# Patient Record
Sex: Female | Born: 1965 | Race: White | Hispanic: No | Smoking: Former smoker
Health system: Southern US, Community
[De-identification: ages and names within clinical notes are randomized; demographics above are authoritative.]

## PROBLEM LIST (undated history)

## (undated) DIAGNOSIS — R7303 Prediabetes: Secondary | ICD-10-CM

## (undated) DIAGNOSIS — K5790 Diverticulosis of intestine, part unspecified, without perforation or abscess without bleeding: Secondary | ICD-10-CM

## (undated) DIAGNOSIS — E063 Autoimmune thyroiditis: Secondary | ICD-10-CM

## (undated) DIAGNOSIS — I82C11 Acute embolism and thrombosis of right internal jugular vein: Secondary | ICD-10-CM

## (undated) DIAGNOSIS — E039 Hypothyroidism, unspecified: Secondary | ICD-10-CM

## (undated) DIAGNOSIS — E785 Hyperlipidemia, unspecified: Secondary | ICD-10-CM

## (undated) DIAGNOSIS — I341 Nonrheumatic mitral (valve) prolapse: Secondary | ICD-10-CM

## (undated) HISTORY — DX: Prediabetes: R73.03

## (undated) HISTORY — DX: Diverticulosis of intestine, part unspecified, without perforation or abscess without bleeding: K57.90

## (undated) HISTORY — DX: Acute embolism and thrombosis of right internal jugular vein: I82.C11

## (undated) HISTORY — DX: Nonrheumatic mitral (valve) prolapse: I34.1

## (undated) HISTORY — DX: Autoimmune thyroiditis: E06.3

## (undated) HISTORY — DX: Hypothyroidism, unspecified: E03.9

## (undated) HISTORY — DX: Hyperlipidemia, unspecified: E78.5

---

## 2015-07-19 DIAGNOSIS — D72829 Elevated white blood cell count, unspecified: Secondary | ICD-10-CM | POA: Insufficient documentation

## 2019-05-04 HISTORY — PX: OTHER SURGICAL HISTORY: SHX169

## 2019-06-14 DIAGNOSIS — F17200 Nicotine dependence, unspecified, uncomplicated: Secondary | ICD-10-CM | POA: Insufficient documentation

## 2019-06-19 ENCOUNTER — Other Ambulatory Visit: Payer: Self-pay | Admitting: Surgery

## 2019-06-19 DIAGNOSIS — R599 Enlarged lymph nodes, unspecified: Secondary | ICD-10-CM

## 2019-06-19 DIAGNOSIS — R911 Solitary pulmonary nodule: Secondary | ICD-10-CM

## 2019-06-23 DIAGNOSIS — R590 Localized enlarged lymph nodes: Secondary | ICD-10-CM | POA: Insufficient documentation

## 2019-06-26 ENCOUNTER — Encounter (HOSPITAL_COMMUNITY): Payer: PRIVATE HEALTH INSURANCE

## 2019-07-04 DIAGNOSIS — C3431 Malignant neoplasm of lower lobe, right bronchus or lung: Secondary | ICD-10-CM | POA: Insufficient documentation

## 2019-07-06 DIAGNOSIS — I82C11 Acute embolism and thrombosis of right internal jugular vein: Secondary | ICD-10-CM | POA: Insufficient documentation

## 2019-07-08 ENCOUNTER — Telehealth: Payer: Self-pay | Admitting: Internal Medicine

## 2019-07-08 NOTE — Telephone Encounter (Signed)
Received a new pt referral for Pam Wilson to see Dr. Julien Nordmann for lung cancer on 4/20 at 2:15pm w/labs at 1:45pm. Pt aware to arrive 30 minutes early.

## 2019-07-14 ENCOUNTER — Ambulatory Visit: Payer: PRIVATE HEALTH INSURANCE | Admitting: Internal Medicine

## 2019-07-15 ENCOUNTER — Encounter: Payer: Self-pay | Admitting: Internal Medicine

## 2019-07-15 ENCOUNTER — Other Ambulatory Visit: Payer: Self-pay

## 2019-07-15 ENCOUNTER — Other Ambulatory Visit: Payer: Self-pay | Admitting: Medical Oncology

## 2019-07-15 ENCOUNTER — Inpatient Hospital Stay (HOSPITAL_BASED_OUTPATIENT_CLINIC_OR_DEPARTMENT_OTHER): Payer: 59 | Admitting: Internal Medicine

## 2019-07-15 ENCOUNTER — Inpatient Hospital Stay: Payer: 59

## 2019-07-15 ENCOUNTER — Encounter: Payer: Self-pay | Admitting: *Deleted

## 2019-07-15 DIAGNOSIS — C3411 Malignant neoplasm of upper lobe, right bronchus or lung: Secondary | ICD-10-CM | POA: Diagnosis not present

## 2019-07-15 DIAGNOSIS — C3491 Malignant neoplasm of unspecified part of right bronchus or lung: Secondary | ICD-10-CM | POA: Insufficient documentation

## 2019-07-15 DIAGNOSIS — Z801 Family history of malignant neoplasm of trachea, bronchus and lung: Secondary | ICD-10-CM

## 2019-07-15 DIAGNOSIS — Z87891 Personal history of nicotine dependence: Secondary | ICD-10-CM

## 2019-07-15 DIAGNOSIS — R7303 Prediabetes: Secondary | ICD-10-CM

## 2019-07-15 DIAGNOSIS — C778 Secondary and unspecified malignant neoplasm of lymph nodes of multiple regions: Secondary | ICD-10-CM

## 2019-07-15 DIAGNOSIS — I82C11 Acute embolism and thrombosis of right internal jugular vein: Secondary | ICD-10-CM

## 2019-07-15 DIAGNOSIS — C3490 Malignant neoplasm of unspecified part of unspecified bronchus or lung: Secondary | ICD-10-CM

## 2019-07-15 DIAGNOSIS — Z5111 Encounter for antineoplastic chemotherapy: Secondary | ICD-10-CM | POA: Insufficient documentation

## 2019-07-15 DIAGNOSIS — Z7189 Other specified counseling: Secondary | ICD-10-CM | POA: Insufficient documentation

## 2019-07-15 MED ORDER — LIDOCAINE-PRILOCAINE 2.5-2.5 % EX CREA: TOPICAL_CREAM | CUTANEOUS | 0 refills | Status: DC

## 2019-07-15 MED ORDER — PROCHLORPERAZINE MALEATE 10 MG PO TABS: 10.0000 mg | ORAL_TABLET | Freq: Four times a day (QID) | ORAL | 0 refills | Status: DC | PRN

## 2019-07-15 NOTE — Progress Notes (Signed)
START ON PATHWAY REGIMEN - Non-Small Cell Lung     Administer weekly:     Paclitaxel      Carboplatin   **Always confirm dose/schedule in your pharmacy ordering system**  Patient Characteristics: Preoperative or Nonsurgical Candidate (Clinical Staging), Stage III - Nonsurgical Candidate (Nonsquamous and Squamous), PS = 0, 1 Therapeutic Status: Preoperative or Nonsurgical Candidate (Clinical Staging) AJCC T Category: cT2a AJCC N Category: cN3 AJCC M Category: cM0 AJCC 8 Stage Grouping: IIIB ECOG Performance Status: 1 Intent of Therapy: Curative Intent, Discussed with Patient

## 2019-07-15 NOTE — Progress Notes (Signed)
Oncology Nurse Navigator Documentation  Oncology Nurse Navigator Flowsheets 07/15/2019  Diagnosis Status Pending Molecular Studies  Planned Course of Treatment Chemo/Radiation Concurrent  Phase of Treatment Chemo/Radiation Concurrent  Navigator Follow Up Date: 07/18/2019  Navigator Follow Up Reason: Appointment Review  Navigator Location [facility]  Navigator Encounter Type Clinic/MDC;Initial MedOnc  Patient Visit Type MedOnc/spoke with patient and daughter today at clinic.  Pam Wilson has been dx with stage III lung cancer and her treatment plan is concurrent chemo rad.  I help to explain next steps.  I will reach out to auth coordinator to get MRI brain auth.  I will update Rad Onc on referral.  I will follow up on pathology and where next gen sequencing was completed.  It was a pleasure meeting them today, very nice family.  Treatment Phase Pre-Tx/Tx Discussion  Barriers/Navigation Needs Coordination of Care;Education  Education Other  Interventions Coordination of Care;Education;Psycho-Social Support  Acuity Level 2-Minimal Needs (1-2 Barriers Identified)  Coordination of Care Other  Education Method Verbal  Time Spent with Patient 30

## 2019-07-15 NOTE — Progress Notes (Signed)
[place] Telephone:([phone]) [phone]   [phone]  CONSULT NOTE  REFERRING PHYSICIAN: Dr. Wilmon Arms  REASON FOR CONSULTATION:  54 years old white female recently diagnosed with lung cancer.  HPI Pam Wilson is a 54 y.o. female with past medical history significant for mitral valve prolapse, Hashimoto's thyroiditis, hypothyroidism, dyslipidemia, diverticulosis as well as prediabetes, right internal jugular vein thrombosis as well as long history of smoking.  The patient mentions that a month ago she was seen by her primary care physician complaining of swelling and pain in the right neck area.  She was sent to the emergency department for evaluation.  CT of the neck was performed on June 14, 2019 and it showed thrombosis of a portion of the right internal jugular vein in the mid to lower neck extending down to the subclavian confluence.  There was also appear to be multiple enlarged lymph nodes within the upper mediastinum as well as the right supraclavicular region the largest lymph node seen in the upper mediastinum appeared approximately 1.8 cm in short axis in the right paratracheal region.  The largest lymph node in the supraclavicular region on the right measured approximately 0.9 cm concerning for possible lymphoproliferative disorder versus metastatic adenopathy.  The patient had CT of the chest performed on June 16, 2019 and it showed spiculated mass in the right upper lobe anteriorly measuring 2.4 x 1.5 cm consistent with bronchogenic carcinoma.  There was smooth pulmonary nodule along the minor fissure between the right middle lobe and right upper lobe measuring 0.8 x 0.5 cm indeterminate for noncalcified granuloma versus hematogenous metastasis.  There was severe mediastinal lymphadenopathy and bulky right hilar adenopathy extending to involve the right lower lobe bronchovascular bundle.  There was no evidence for pulmonary embolus.  CT of the abdomen and  pelvis on the same day showed no acute abnormalities and no evidence of metastatic disease in the abdomen or pelvis.  She underwent CT-guided core biopsy of the right upper lobe lung mass by interventional radiology but this was not conclusive for malignancy and complicated with pneumothorax requiring chest tube placement.  The patient also had a PET scan performed on June 22, 2019 and it showed hypermetabolic right upper lobe neoplasm in addition to pathologic right hilar and mediastinal lymphadenopathy there was no evidence of contralateral left hilar adenopathy or distant metastatic disease.  The patient underwent mediastinoscopy on June 25, 2019 under the care of Dr. Eulas Post.  The final pathology of the cervical lymph node excisional biopsy as well as right level 3 excisional biopsy showed metastatic poorly differentiated non-small cell carcinoma. The carcinoma is strongly diffusely positive for TTF-1, favoring metastatic adenocarcinoma of the lung. 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 The pathology biopsy was sent for molecular studies but the results are not available to me at this point. The patient moved to [facility] to be close to her daughter during the treatment. She was referred to me today for evaluation and recommendation regarding treatment of her condition. When seen today she is feeling tired and she has a lot of stress after the moving process.  She denied having any current chest pain, shortness of breath, cough or hemoptysis.  She has pain on the right shoulder area.  She denied having any nausea, vomiting, diarrhea or constipation.  She denied having any headache or visual changes. Family history significant for mother with lung cancer at age 47, brother had lung  cancer at age 54 and father had diabetes mellitus. The patient is single and has 3 children.  She was accompanied today by her daughter Pam Wilson.  She used  to work for [employer].  She has a history of smoking 1 pack/day for around 38 years and quit June 14, 2019.  The patient has no history of alcohol or drug abuse.  HPI  Past Medical History:  Diagnosis Date   Diverticulosis    Dyslipidemia    Hashimoto's thyroiditis    Hypothyroidism    Internal jugular vein thrombosis, right (HCC)    Mitral valve prolapse    Prediabetes     History reviewed. No pertinent surgical history.  Family History  Problem Relation Age of Onset   Lung cancer Mother    Diabetes Mellitus II Father    Lung cancer Brother     Social History Social History   Tobacco Use   Smoking status: Former Smoker    Packs/day: 1.00    Years: 38.00    Pack years: 38.00    Types: Cigarettes   Smokeless tobacco: Never Used  Substance Use Topics   Alcohol use: Not Currently   Drug use: Never    Allergies  Allergen Reactions   Sulfamethoxazole-Trimethoprim Hives   Penicillins Rash    Current Outpatient Medications  Medication Sig Dispense Refill   apixaban (ELIQUIS) 5 MG TABS tablet Take by mouth.     bacitracin ointment Apply topically.     colesevelam (WELCHOL) 625 MG tablet Take by mouth.     colesevelam (WELCHOL) 625 MG tablet Take 1,250 mg by mouth daily.     DENTA 5000 PLUS 1.1 % CREA dental cream BRUSH THOUROGHLY TWICE DAILY DO NOT RINSE EAT OR DRINK AFTERWARD     EUTHYROX 112 MCG tablet Take 112 mcg by mouth daily.     levothyroxine (SYNTHROID) 112 MCG tablet Take by mouth.     liothyronine (CYTOMEL) 5 MCG tablet Take by mouth.     metFORMIN (GLUCOPHAGE-XR) 750 MG 24 hr tablet Take by mouth.     Multiple Vitamin (MULTI-VITAMIN) tablet Take by mouth.     rosuvastatin (CRESTOR) 5 MG tablet Take 5 mg by mouth daily.     No current facility-administered medications for this visit.    Review of Systems  Constitutional: positive for fatigue Eyes: negative Ears, nose, mouth, throat, and face:  negative Respiratory: negative Cardiovascular: negative Gastrointestinal: negative Genitourinary:negative Integument/breast: negative Hematologic/lymphatic: negative Musculoskeletal:negative Neurological: negative Behavioral/Psych: negative Endocrine: negative Allergic/Immunologic: negative  Physical Exam  OEH:OZYYQ, healthy, no distress, well nourished, well developed and anxious SKIN: skin color, texture, turgor are normal, no rashes or significant lesions HEAD: Normocephalic, No masses, lesions, tenderness or abnormalities EYES: normal, PERRLA, Conjunctiva are pink and non-injected EARS: External ears normal, Canals clear OROPHARYNX:no exudate, no erythema and lips, buccal mucosa, and tongue normal  NECK: supple, no adenopathy, no JVD LYMPH:  no palpable lymphadenopathy, no hepatosplenomegaly BREAST:not examined LUNGS: clear to auscultation , and palpation HEART: regular rate & rhythm, no murmurs and no gallops ABDOMEN:abdomen soft, non-tender, normal bowel sounds and no masses or organomegaly BACK: No CVA tenderness, Range of motion is normal EXTREMITIES:no joint deformities, effusion, or inflammation, no edema  NEURO: alert & oriented x 3 with fluent speech, no focal motor/sensory deficits  PERFORMANCE STATUS: ECOG 1  LABORATORY DATA: No results found for: WBC, HGB, HCT, MCV, PLT    Chemistry   No results found for: NA, K, CL, CO2, BUN, CREATININE, GLU No  results found for: CALCIUM, ALKPHOS, AST, ALT, BILITOT     RADIOGRAPHIC STUDIES: No results found.  ASSESSMENT: This is a very pleasant 54 years old white female recently diagnosed with a stage IIIb (T1c, N3, M0) non-small cell lung cancer, adenosquamous carcinoma presented with right upper lobe lung nodule in addition to mediastinal and supraclavicular lymphadenopathy diagnosed in March 2021.  Molecular studies are still pending.   PLAN: I had a lengthy discussion with the patient and her daughter today about her  current disease stage, prognosis and treatment options. I recommended for the patient to complete the staging work-up by ordering MRI of the brain to rule out brain metastasis. I will also request the molecular study report from [facility] once it becomes available. I discussed with the patient her treatment options and I recommended for her a course of concurrent chemoradiation with weekly carboplatin for AUC of 2 and paclitaxel 45 NG/M2 for 6-7 weeks followed by consolidation immunotherapy if the patient has no evidence for disease progression after the induction phase. I discussed with the patient the adverse effect of the chemotherapy including possibility for mild alopecia, myelosuppression, nausea and vomiting, peripheral neuropathy, liver or renal dysfunction. She is interested in proceeding with the treatment. She is expected to start the first dose of this treatment on Jul 28, 2019. I will refer the patient to radiation oncology for evaluation and discussion of the radiotherapy portion of this treatment. I will arrange for the patient to have a chemotherapy education class before the first dose of her treatment. I will call her pharmacy with prescription for Compazine 10 mg p.o. every 6 hours as needed for nausea in addition to EMLA cream to be applied to the Port-A-Cath site before treatment. The patient will come back for follow-up visit in 2 weeks with the first day of her treatment. For smoking cessation, I strongly encouraged the patient to continue with the current plan of quitting smoking. She was advised to call immediately if she has any concerning symptoms in the interval.  The patient voices understanding of current disease status and treatment options and is in agreement with the current care plan.  All questions were answered. The patient knows to call the clinic with any problems, questions or concerns. We can certainly see the patient much sooner if necessary.  Thank you  so much for allowing me to participate in the care of Pam Wilson. I will continue to follow up the patient with you and assist in her care.   Disclaimer: This note was dictated with voice recognition software. Similar sounding words can inadvertently be transcribed and may not be corrected upon review.   Eilleen Kempf July 15, 2019, 2:36 PM

## 2019-07-16 ENCOUNTER — Inpatient Hospital Stay
Admission: RE | Admit: 2019-07-16 | Discharge: 2019-07-16 | Disposition: A | Discharge status: Home / Self Care | Payer: Self-pay | Source: Ambulatory Visit | Attending: Internal Medicine | Admitting: Internal Medicine

## 2019-07-16 ENCOUNTER — Ambulatory Visit
Admission: RE | Admit: 2019-07-16 | Discharge: 2019-07-16 | Disposition: A | Discharge status: Home / Self Care | Payer: Self-pay | Source: Ambulatory Visit | Attending: Internal Medicine | Admitting: Internal Medicine

## 2019-07-16 ENCOUNTER — Other Ambulatory Visit (HOSPITAL_COMMUNITY): Payer: Self-pay | Admitting: Internal Medicine

## 2019-07-16 ENCOUNTER — Telehealth: Payer: Self-pay | Admitting: Medical Oncology

## 2019-07-16 DIAGNOSIS — C801 Malignant (primary) neoplasm, unspecified: Secondary | ICD-10-CM

## 2019-07-16 NOTE — Telephone Encounter (Signed)
I left a  VM to fax molecular studies to  [phone] and to call for any questions.

## 2019-07-16 NOTE — Telephone Encounter (Signed)
Duplicate orders-MRI order brain( from another doctor)  is in process of being authorized . I told pt that she will need to go to that facility to get her brain MRI. I told her Dr Julien Nordmann 's order for MRI brain cannot be processed. I instructed pt to contact the doctor who ordered the brain MRI and let them know she has not heard about an appt . She said she wants it done here. I told her that it may be hard for that first doctor to cancel his mri brain  order since it is already in process to be authorized.  I cancelled Dr Worthy Flank MRI brain order.

## 2019-07-16 NOTE — Progress Notes (Signed)
Thoracic Location of Tumor / Histology: RUL- Non small cell  Patient presented with right sided neck swelling.  MRI Brain unscheduled at this time.  PET 1/60/7371: hypermetabolic right upper lobe neoplasm in addition to pathologic right hilar and mediastinal lymphadenopathy there was no evidence of contralateral left hilar adenopathy or distant metastatic disease.  CT abdomen/pelvis 06/16/2019: no acute abnormalities and no evidence of metastatic disease in the abdomen or pelvis.   CT Chest 06/16/2019: spiculated mass in the right upper lobe anteriorly measuring 2.4 x 1.5 cm consistent with bronchogenic carcinoma.  There was smooth pulmonary nodule along the minor fissure between the right middle lobe and right upper lobe measuring 0.8 x 0.5 cm indeterminate for noncalcified granuloma versus hematogenous metastasis.  There was severe mediastinal lymphadenopathy and bulky right hilar adenopathy extending to involve the right lower lobe bronchovascular bundle.  There was no evidence for pulmonary embolus.  CT Neck 06/14/2019: thrombosis of a portion of the right internal jugular vein in the mid to lower neck extending down to the subclavian confluence.  There was also appear to be multiple enlarged lymph nodes within the upper mediastinum as well as the right supraclavicular region the largest lymph node seen in the upper mediastinum appeared approximately 1.8 cm in short axis in the right paratracheal region.  The largest lymph node in the supraclavicular region on the right measured approximately 0.9 cm concerning for possible lymphoproliferative disorder versus metastatic adenopathy.      Biopsies of Lymph nodes 07/01/2019    Biopsies of RUL Lung 06/24/2019   Tobacco/Marijuana/Snuff/ETOH use: Former Smoker, quit 05/2019.  Past/Anticipated interventions by cardiothoracic surgery, if any:   Past/Anticipated interventions by medical oncology, if any:  Dr. Mckinley Jewel 07/15/2019 -I recommended for the  patient to complete the staging work-up by ordering MRI of the brain to rule out brain metastasis. -I will also request the molecular study report from [facility] once it becomes available. -I discussed with the patient her treatment options and I recommended for her a course of concurrent chemoradiation with weekly carboplatin for AUC of 2 and paclitaxel 45 NG/M2 for 6-7 weeks followed by consolidation immunotherapy if the patient has no evidence for disease progression after the induction phase. -She is interested in proceeding with the treatment. -She is expected to start the first dose of this treatment on Jul 28, 2019. -I will refer the patient to radiation oncology for evaluation and discussion of the radiotherapy portion of this treatment. -I will arrange for the patient to have a chemotherapy education class before the first dose of her treatment.  Signs/Symptoms  Weight changes, if any: No  Respiratory complaints, if any: No issues noted.  Hemoptysis, if any: No cough, no blood.  Pain issues, if any:  No  SAFETY ISSUES:  Prior radiation? No  Pacemaker/ICD? No  Possible current pregnancy? No  Is the patient on methotrexate? No  Current Complaints / other details:

## 2019-07-17 ENCOUNTER — Encounter: Payer: Self-pay | Admitting: Radiation Oncology

## 2019-07-17 ENCOUNTER — Inpatient Hospital Stay: Admission: RE | Admit: 2019-07-17 | Payer: Self-pay | Source: Ambulatory Visit | Admitting: Radiation Oncology

## 2019-07-17 ENCOUNTER — Encounter: Payer: Self-pay | Admitting: General Practice

## 2019-07-17 ENCOUNTER — Encounter: Payer: Self-pay | Admitting: *Deleted

## 2019-07-17 ENCOUNTER — Ambulatory Visit: Payer: PRIVATE HEALTH INSURANCE

## 2019-07-17 ENCOUNTER — Ambulatory Visit: Payer: PRIVATE HEALTH INSURANCE | Admitting: Radiation Oncology

## 2019-07-17 ENCOUNTER — Ambulatory Visit
Admission: RE | Admit: 2019-07-17 | Discharge: 2019-07-17 | Disposition: A | Discharge status: Home / Self Care | Payer: PRIVATE HEALTH INSURANCE | Source: Ambulatory Visit | Attending: Radiation Oncology | Admitting: Radiation Oncology

## 2019-07-17 ENCOUNTER — Inpatient Hospital Stay
Admission: RE | Admit: 2019-07-17 | Payer: PRIVATE HEALTH INSURANCE | Source: Ambulatory Visit | Admitting: Radiation Oncology

## 2019-07-17 ENCOUNTER — Other Ambulatory Visit: Payer: Self-pay

## 2019-07-17 VITALS — Ht 68.0 in | Wt 139.0 lb

## 2019-07-17 DIAGNOSIS — C3491 Malignant neoplasm of unspecified part of right bronchus or lung: Secondary | ICD-10-CM

## 2019-07-17 DIAGNOSIS — C3411 Malignant neoplasm of upper lobe, right bronchus or lung: Secondary | ICD-10-CM

## 2019-07-17 DIAGNOSIS — C3490 Malignant neoplasm of unspecified part of unspecified bronchus or lung: Secondary | ICD-10-CM

## 2019-07-17 NOTE — Progress Notes (Signed)
[employer] Initial Psychosocial Assessment Clinical Social Work  Clinical Social Work contacted by phone to assess psychosocial, emotional, mental health, and spiritual needs of the patient.   Barriers to care/review of distress screen:  - Transportation:  Do you anticipate any problems getting to appointments?  Do you have someone who can help run errands for you if you need it?  She is able to drive, daughter can help if needed.   - Help at home:  What is your living situation (alone, family, other)?  If you are physically unable to care for yourself, who would you call on to help you?  Has relocated to [facility] to live w daughter while in treatment.  Owns her own home in [place].  Housing stress reported in Distress Screen refers to stress of moving to new home and temporarily relocating to [place] to be w daughter during treatment.   - Support system:  What does your support system look like?  Who would you call on if you needed some kind of practical help?  What if you needed someone to talk to for emotional support?  Is single with three adult children - all live in [place]. Has "a couple of friends, moved from [place] two years ago."  Has developed a support system.    - Finances:  Are you concerned about finances.  Considering returning to work?  If not, applying for disability?  Was working, job opened in [employer] [place], bought new home in [place] and was getting ready to start new job in [place].  Does have short term disability w her company, has submitted paperwork for short term disability to oncologist office.  Has been out of work since 06/14/19.  Has used up all her vacation time.  Advised to call her HR department to clarify that she has short and long term disability.    What is your understanding of where you are with your cancer? Its cause?  Your treatment plan and what happens next?  Recently diagnosed w Stage 3 lung cancer while in process of moving from [place] to [place], [place].   Was "coming home from work and I was having a hard time swallowing."  Didn't resolve by next day, went to MD who "literally walked me over to the ED", had a blood clot, then scans revealed cancer in lungs and lymph nodes.  Plan is 6 - 7 weeks of concurrent chemotherapy and radiation.    If Distress Screen is positive for depression, insert PHQ   If Distress Screen is positive for anxiety, insert GAD 7  What are your worries for the future as you begin treatment for cancer?  FMLA paperwork submitted to [employer] on 4/20.  Not sure she can return to work w port in place and undergoing treatment.  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  Wants to get a will completed.    What are your hopes and priorities during your treatment? What is important to you? What are your goals for your care? Wants to remain active, likes to walk.  Wants to return to work after treatment.  Likes to have snacks with her during treatment.   Appreciates having daughter present during visits.    ONCBCN DISTRESS SCREENING 07/17/2019  Screening Type Initial Screening  Distress experienced in past week (1-10) 7  Practical problem type Housing  Other Contact via phone    CSW Summary:  Patient and family psychosocial functioning including strengths, limitations,  and coping skills: 54 yo single female, newly diagnosed with Stage 3 lung cancer.  Will have concurrent chemotherapy and radiation for 6 - 7 weeks.  Temporarily living w daughter in [place], has home in [place] and job in [place]. After treatment, she plans to return to work/home.  Is normally very active - goes to gym, lifts weights, walks.  Has physical job.  Was a bit more tired than usual in past several months, but was also moving from one house to another and has physical stress of packing/moving.  No previous health challenges.  Supportive family, all adult children.  CSW and patient discussed common feeling and emotions when being  diagnosed with cancer, and the importance of support during treatment. CSW informed patient of the support team and support services at [facility]. CSW provided contact information and encouraged patient to call with any questions or concerns.  Identifications of barriers to care:  Wants to be sure FMLA and short term disability paperwork is completed by [facility] team so her job is protected.    Availability of community resources:  Lung Cancer Initiative, Cancer and Careers, Triage Cancer  Clinical Social Worker follow up needed: No.   Edwyna Shell, [place] Social Worker Phone:  [phone] Cell:  [phone]

## 2019-07-17 NOTE — Progress Notes (Signed)
Radiation Oncology         [phone]) [phone] ________________________________  Name: Pam Wilson        MRN: [id number]  Date of Service: 07/17/2019 DOB: 1965-10-05  CH:YIFOYDX, No Pcp Per  Curt Bears, MD     REFERRING PHYSICIAN: Curt Bears, MD   DIAGNOSIS: The primary encounter diagnosis was Non-small cell cancer of right lung ([place]). A diagnosis of Malignant neoplasm of unspecified part of unspecified bronchus or lung ([place]) was also pertinent to this visit.   HISTORY OF PRESENT ILLNESS: Pam Wilson is a 54 y.o. female seen at the request of Dr. Julien Nordmann for a newly diagnosed lung cancer. The patient started noting swelling in her neck a little over a month ago and she was sent for CT of the neck which was performed on 06/14/2019 revealing a thrombus in the right internal jugular vein extending down to the subclavian confluence, there were also multiple enlarged lymph nodes in the right supraclavicular region and in the mediastinum as an example and an 11 mm and 12 mm lymph node were seen in the right paratracheal region, another measuring 18 mm, the largest in the supraclavicular region was 9 mm on the right.  She proceeded with CT chest abdomen and pelvis on 06/16/2019 this revealed moderate emphysematous and fibrotic changes of the lungs, a 24 x 15 L spiculated right upper lobe mass, and smooth pulmonary nodule along the minor fissure of the right middle lobe and right upper lobe measuring 8 x 5 mm.  Significant mediastinal adenopathy including bulky right hilar adenopathy was identified.  She subsequently underwent a biopsy on 06/19/2019 to sample the right upper lobe nodule.  Final pathology revealed benign pleura with fibrosis negative for malignancy.  Unfortunately she developed a pneumothorax, and had a chest tube placed on 06/19/2019 as well.  She was able to undergo pet imaging on 06/22/2019 which revealed hypermetabolism in the right upper lobe, right hilum and mediastinal  adenopathy.  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  Her cervical lymph node was also consistent with metastatic poorly differentiated non-small cell lung cancer also consistent with adenosquamous features.  She was evaluated by Dr. Julien Nordmann and she is planning to undergo an MRI of the brain to complete her staging work-up, it appears that she has stage III disease, and he has discussed that provided her MRI is clear of disease, she would be a candidate for chemoradiation.  She is seen today to discuss treatment recommendations.     PREVIOUS RADIATION THERAPY: No   PAST MEDICAL HISTORY:  Past Medical History:  Diagnosis Date  . Diverticulosis   . Dyslipidemia   . Hashimoto's thyroiditis   . Hypothyroidism   . Internal jugular vein thrombosis, right ([place])   . Mitral valve prolapse   . Prediabetes        PAST SURGICAL HISTORY:No past surgical history on file.   FAMILY HISTORY:  Family History  Problem Relation Age of Onset  . Lung cancer Mother   . Diabetes Mellitus II Father   . Lung cancer Brother      SOCIAL HISTORY:  reports that she has quit smoking. Her smoking use included cigarettes. She has a 38.00 pack-year smoking history. She has never used smokeless tobacco. She reports previous alcohol use. She reports that she does not use drugs.The  patient is divorced and lives in [place], and [place] given her diagnosis.   ALLERGIES: Sulfamethoxazole-trimethoprim and Penicillins   MEDICATIONS:  Current Outpatient Medications  Medication Sig Dispense Refill  . apixaban (ELIQUIS) 5 MG TABS tablet Take by mouth.    . bacitracin ointment Apply topically.    . colesevelam (WELCHOL) 625 MG tablet Take by mouth.     . colesevelam (WELCHOL) 625 MG tablet Take 1,250 mg by mouth daily.    . DENTA 5000 PLUS 1.1 % CREA dental cream BRUSH THOUROGHLY TWICE DAILY DO NOT RINSE EAT OR DRINK AFTERWARD    . EUTHYROX 112 MCG tablet Take 112 mcg by mouth daily.    Marland Kitchen levothyroxine (SYNTHROID) 112 MCG tablet Take by mouth.    . lidocaine-prilocaine (EMLA) cream Apply to Port-A-Cath site 30-60 minutes before treatment. 30 g 0  . liothyronine (CYTOMEL) 5 MCG tablet Take by mouth.    . metFORMIN (GLUCOPHAGE-XR) 750 MG 24 hr tablet Take by mouth.    . Multiple Vitamin (MULTI-VITAMIN) tablet Take by mouth.    . prochlorperazine (COMPAZINE) 10 MG tablet Take 1 tablet (10 mg total) by mouth every 6 (six) hours as needed for nausea or vomiting. 30 tablet 0  . rosuvastatin (CRESTOR) 5 MG tablet Take 5 mg by mouth daily.     No current facility-administered medications for this encounter.     REVIEW OF SYSTEMS: On review of systems, the patient reports that she is doing well overall. She has a sore area on her neck from her biopsy that is a hematoma. She denies any chest pain, shortness of breath, cough, fevers, chills, night sweats, unintended weight loss but she's gained about 5 pounds due to recently quitting smoking. She denies any bowel or bladder disturbances, and denies abdominal pain, nausea or vomiting. She denies any new musculoskeletal or joint aches or pains. A complete review of systems is obtained and is otherwise negative.     PHYSICAL EXAM:  Wt Readings from Last 3 Encounters:  07/15/19 139 lb 12.8 oz (63.4 kg)    In general this is a well appearing caucasian female in no acute distress. She's alert and oriented x4 and appropriate throughout the examination. Cardiopulmonary assessment is negative for acute distress and she exhibits normal effort.   ECOG = 1  0 - Asymptomatic (Fully active, able to carry on all predisease activities without restriction)  1 - Symptomatic but completely ambulatory (Restricted  in physically strenuous activity but ambulatory and able to carry out work of a light or sedentary nature. For example, light housework, office work)  2 - Symptomatic, <50% in bed during the day (Ambulatory and capable of all self care but unable to carry out any work activities. Up and about more than 50% of waking hours)  3 - Symptomatic, >50% in bed, but not bedbound (Capable of only limited self-care, confined to bed or chair 50% or more of waking hours)  4 - Bedbound (Completely disabled. Cannot carry on any self-care. Totally confined to bed or chair)  5 - Death   Eustace Pen MM, Creech RH, Tormey DC, et al. [phone]). "Toxicity and response criteria of the [facility]". [place] Oncol. 5 (6): 649-55    LABORATORY DATA:  No results found for: WBC, HGB, HCT, MCV, PLT No results found for: NA, K, CL, CO2 No results found for: ALT, AST, GGT, ALKPHOS, BILITOT    RADIOGRAPHY: DG Outside  Films Chest  Result Date: 07/16/2019 This examination belongs to an outside facility and is stored here for comparison purposes only.  Contact the originating outside institution for any associated report or interpretation.  DG Outside Films Chest  Result Date: 07/16/2019 This examination belongs to an outside facility and is stored here for comparison purposes only.  Contact the originating outside institution for any associated report or interpretation.  CT OUTSIDE FILMS HEAD/FACE  Result Date: 07/16/2019 This examination belongs to an outside facility and is stored here for comparison purposes only.  Contact the originating outside institution for any associated report or interpretation.      IMPRESSION/PLAN: 1. Stage IIIB, cT2aN3M0, NSCLC, adenosquamous carcinoma of the RUL. Dr. Lisbeth Renshaw discusses the pathology findings and reviews the nature of locally advanced lung cancer. He reviews the rationale to proceed with brain MRI which was reordered given her other pending orders from  [facility] that have since been cancelled. She would benefit from chemoradiation and we discussed the rationale for this regimen. We discussed the risks, benefits, short, and long term effects of radiotherapy, and the patient is interested in proceeding. Dr. Lisbeth Renshaw discusses the delivery and logistics of radiotherapy and anticipates a course of 6 1/2 weeks of radiotherapy.  She was given an appointment for Tuesday of next week for simulation at which time she will signed written consent to proceed.  We would plan to begin chemoradiation on 07/28/2019.  This encounter was provided by telemedicine platform MyChart.  The patient has provided two factor identification and has given verbal consent for this type of encounter and has been advised to only accept a meeting of this type in a secure network environment. The time spent during this encounter was 45 minutes including preparation, discussion, and coordination of the patient's care. The attendants for this meeting include Blenda Nicely, RN, Dr. Lisbeth Renshaw, Hayden Pedro  and Nena Alexander and her daughter Rush Barer.  During the encounter,  Blenda Nicely, RN, Dr. Lisbeth Renshaw, and Hayden Pedro were located at [facility] Radiation Oncology Department.  Keylee Shrestha was located at home with her daughter Rush Barer.    The above documentation reflects my direct findings during this shared patient visit. Please see the separate note by Dr. Lisbeth Renshaw on this date for the remainder of the patient's plan of care.    Carola Rhine, PAC

## 2019-07-17 NOTE — Progress Notes (Signed)
Oncology Nurse Navigator Documentation  Oncology Nurse Navigator Flowsheets 07/17/2019  Diagnosis Status -  Planned Course of Treatment -  Phase of Treatment -  Navigator Follow Up Date: -  Navigator Follow Up Reason: -  Navigator Location [facility]  Navigator Encounter Type Other/I called thoracic surgery Dr. [place] office to enquire about molecular testing.  I was given pathology number to call. I called pathology.  Second gen sequencing was not completed.  They will send but need an order.  Dr. Julien Nordmann completed order and I faxed to path dept at [facility].  I also spoke with Les at [facility] with an update.  She gave me the fax number to fax.  Fax completed and verification received.   Patient Visit Type -  Treatment Phase Pre-Tx/Tx Discussion  Barriers/Navigation Needs Coordination of Care  Education -  Interventions Coordination of Care  Acuity Level 3-Moderate Needs (3-4 Barriers Identified)  Coordination of Care Other  Education Method -  Time Spent with Patient 45

## 2019-07-18 ENCOUNTER — Telehealth: Payer: Self-pay | Admitting: *Deleted

## 2019-07-18 NOTE — Telephone Encounter (Signed)
Received call from pt stating that she has an appt on 4/28 for education & she doesn't know what that it.  Returned call & informed pt that this is education for her treatment drugs.  She would like to move this appt to tues 4/27 @ 10 am.  Message to schedulers to take care of this.

## 2019-07-21 ENCOUNTER — Other Ambulatory Visit: Payer: Self-pay | Admitting: Medical Oncology

## 2019-07-21 ENCOUNTER — Telehealth: Payer: Self-pay | Admitting: *Deleted

## 2019-07-21 ENCOUNTER — Telehealth: Payer: Self-pay | Admitting: Internal Medicine

## 2019-07-21 DIAGNOSIS — I878 Other specified disorders of veins: Secondary | ICD-10-CM

## 2019-07-21 NOTE — Telephone Encounter (Signed)
Scheduled per los. Called and spoke with patient. Confirmed appts  

## 2019-07-21 NOTE — Telephone Encounter (Signed)
Spoke with the patient to ask her to reach out to [facility] to make sure that they have cancelled her upcoming MRI scan so that we can order one here.  She verbalized understanding and will reach out to them today.  Will continue to follow as necessary.  Gloriajean Dell. Leonie Green, BSN

## 2019-07-22 ENCOUNTER — Ambulatory Visit
Admission: RE | Admit: 2019-07-22 | Discharge: 2019-07-22 | Disposition: A | Discharge status: Home / Self Care | Payer: 59 | Source: Ambulatory Visit | Attending: Radiation Oncology | Admitting: Radiation Oncology

## 2019-07-22 ENCOUNTER — Other Ambulatory Visit: Payer: Self-pay

## 2019-07-22 ENCOUNTER — Inpatient Hospital Stay: Payer: 59

## 2019-07-22 ENCOUNTER — Encounter: Payer: Self-pay | Admitting: *Deleted

## 2019-07-22 ENCOUNTER — Other Ambulatory Visit: Payer: Self-pay | Admitting: *Deleted

## 2019-07-22 DIAGNOSIS — C3411 Malignant neoplasm of upper lobe, right bronchus or lung: Secondary | ICD-10-CM | POA: Diagnosis present

## 2019-07-22 DIAGNOSIS — C770 Secondary and unspecified malignant neoplasm of lymph nodes of head, face and neck: Secondary | ICD-10-CM | POA: Insufficient documentation

## 2019-07-22 NOTE — Progress Notes (Signed)
Oncology Nurse Navigator Documentation  Oncology Nurse Navigator Flowsheets 07/22/2019  Diagnosis Status -  Planned Course of Treatment -  Phase of Treatment -  Navigator Follow Up Date: -  Navigator Follow Up Reason: -  Navigator Location [facility]  Navigator Encounter Type Other/Ms. Shuping already had a port so port order was cancelled. I reached out to Dr. Julien Nordmann to see if patient needs CXR before her first infusion for port placement.   Patient Visit Type -  Treatment Phase Pre-Tx/Tx Discussion  Barriers/Navigation Needs Coordination of Care  Education -  Interventions Coordination of Care  Acuity Level 2-Minimal Needs (1-2 Barriers Identified)  Coordination of Care Other  Education Method -  Time Spent with Patient 30

## 2019-07-22 NOTE — Progress Notes (Signed)
Pharmacist Chemotherapy Monitoring - Initial Assessment    Anticipated start date: 07/28/2019  Regimen:  . Are orders appropriate based on the patient's diagnosis, regimen, and cycle? Yes . Does the plan date match the patient's scheduled date? Yes . Is the sequencing of drugs appropriate? Yes . Are the premedications appropriate for the patient's regimen? Yes . Prior Authorization for treatment is: Not Started o If applicable, is the correct biosimilar selected based on the patient's insurance? not applicable  Organ Function and Labs: Marland Kitchen Are dose adjustments needed based on the patient's renal function, hepatic function, or hematologic function? No . Are appropriate labs ordered prior to the start of patient's treatment? Yes . Other organ system assessment, if indicated: N/A . The following baseline labs, if indicated, have been ordered: N/A  Dose Assessment: . Are the drug doses appropriate? Yes . Are the following correct: o Drug concentrations Yes o IV fluid compatible with drug Yes o Administration routes Yes o Timing of therapy Yes . If applicable, does the patient have documented access for treatment and/or plans for port-a-cath placement? yes . If applicable, have lifetime cumulative doses been properly documented and assessed? yes Lifetime Dose Tracking  No doses have been documented on this patient for the following tracked chemicals: Doxorubicin, Epirubicin, Idarubicin, Daunorubicin, Mitoxantrone, Bleomycin, Oxaliplatin, Carboplatin, Liposomal Doxorubicin  o   Toxicity Monitoring/Prevention: . The patient has the following take home antiemetics prescribed: Prochlorperazine . The patient has the following take home medications prescribed: N/A . Medication allergies and previous infusion related reactions, if applicable, have been reviewed and addressed. Yes . The patient's current medication list has been assessed for drug-drug interactions with their chemotherapy regimen.  no significant drug-drug interactions were identified on review.  Order Review: . Are the treatment plan orders signed? No . Is the patient scheduled to see a provider prior to their treatment? Yes  I verify that I have reviewed each item in the above checklist and answered each question accordingly.  Philomena Course 07/22/2019 9:09 AM

## 2019-07-23 ENCOUNTER — Other Ambulatory Visit: Payer: PRIVATE HEALTH INSURANCE

## 2019-07-25 ENCOUNTER — Encounter: Payer: Self-pay | Admitting: Medical Oncology

## 2019-07-25 ENCOUNTER — Other Ambulatory Visit: Payer: Self-pay | Admitting: Medical Oncology

## 2019-07-25 ENCOUNTER — Other Ambulatory Visit: Payer: Self-pay | Admitting: Physician Assistant

## 2019-07-25 ENCOUNTER — Encounter: Payer: Self-pay | Admitting: Internal Medicine

## 2019-07-25 ENCOUNTER — Telehealth: Payer: Self-pay | Admitting: *Deleted

## 2019-07-25 DIAGNOSIS — I82890 Acute embolism and thrombosis of other specified veins: Secondary | ICD-10-CM

## 2019-07-25 DIAGNOSIS — C3411 Malignant neoplasm of upper lobe, right bronchus or lung: Secondary | ICD-10-CM | POA: Diagnosis not present

## 2019-07-25 DIAGNOSIS — C3491 Malignant neoplasm of unspecified part of right bronchus or lung: Secondary | ICD-10-CM

## 2019-07-25 MED ORDER — APIXABAN 5 MG PO TABS: 5.0000 mg | ORAL_TABLET | Freq: Every day | ORAL | 1 refills | Status: DC

## 2019-07-25 NOTE — Telephone Encounter (Signed)
Connected with patient to complete short term disability claim form physician's statement.  Request patient's SS# noted in EMR as [id number].  Noted in Medical records.

## 2019-07-25 NOTE — Telephone Encounter (Signed)
Patient said she will pick up completed forms next week.

## 2019-07-25 NOTE — Telephone Encounter (Signed)
Eliquis refill requested.

## 2019-07-28 ENCOUNTER — Inpatient Hospital Stay: Payer: 59

## 2019-07-28 ENCOUNTER — Other Ambulatory Visit: Payer: Self-pay

## 2019-07-28 ENCOUNTER — Encounter: Payer: Self-pay | Admitting: Internal Medicine

## 2019-07-28 ENCOUNTER — Ambulatory Visit
Admission: RE | Admit: 2019-07-28 | Discharge: 2019-07-28 | Disposition: A | Discharge status: Home / Self Care | Payer: 59 | Source: Ambulatory Visit | Attending: Radiation Oncology | Admitting: Radiation Oncology

## 2019-07-28 ENCOUNTER — Inpatient Hospital Stay (HOSPITAL_BASED_OUTPATIENT_CLINIC_OR_DEPARTMENT_OTHER): Payer: 59 | Admitting: Internal Medicine

## 2019-07-28 ENCOUNTER — Other Ambulatory Visit: Payer: Self-pay | Admitting: Internal Medicine

## 2019-07-28 ENCOUNTER — Other Ambulatory Visit: Payer: Self-pay | Admitting: Medical Oncology

## 2019-07-28 VITALS — BP 122/80 | HR 76 | Temp 98.3°F | Resp 17 | Ht 68.0 in | Wt 143.3 lb

## 2019-07-28 VITALS — BP 121/76 | HR 63 | Temp 98.6°F | Resp 16

## 2019-07-28 DIAGNOSIS — Z7189 Other specified counseling: Secondary | ICD-10-CM | POA: Diagnosis not present

## 2019-07-28 DIAGNOSIS — C770 Secondary and unspecified malignant neoplasm of lymph nodes of head, face and neck: Secondary | ICD-10-CM | POA: Diagnosis present

## 2019-07-28 DIAGNOSIS — C3411 Malignant neoplasm of upper lobe, right bronchus or lung: Secondary | ICD-10-CM | POA: Insufficient documentation

## 2019-07-28 DIAGNOSIS — I82890 Acute embolism and thrombosis of other specified veins: Secondary | ICD-10-CM | POA: Diagnosis not present

## 2019-07-28 DIAGNOSIS — Z5111 Encounter for antineoplastic chemotherapy: Secondary | ICD-10-CM

## 2019-07-28 DIAGNOSIS — C3491 Malignant neoplasm of unspecified part of right bronchus or lung: Secondary | ICD-10-CM

## 2019-07-28 DIAGNOSIS — Z95828 Presence of other vascular implants and grafts: Secondary | ICD-10-CM

## 2019-07-28 LAB — CBC WITH DIFFERENTIAL (CANCER CENTER ONLY)
Abs Immature Granulocytes: 0.03 10*3/uL (ref 0.00–0.07)
Basophils Absolute: 0.1 10*3/uL (ref 0.0–0.1)
Basophils Relative: 1 %
Eosinophils Absolute: 0.3 10*3/uL (ref 0.0–0.5)
Eosinophils Relative: 3 %
HCT: 40.8 % (ref 36.0–46.0)
Hemoglobin: 13 g/dL (ref 12.0–15.0)
Immature Granulocytes: 0 %
Lymphocytes Relative: 15 %
Lymphs Abs: 1.4 10*3/uL (ref 0.7–4.0)
MCH: 26.6 pg (ref 26.0–34.0)
MCHC: 31.9 g/dL (ref 30.0–36.0)
MCV: 83.6 fL (ref 80.0–100.0)
Monocytes Absolute: 0.9 10*3/uL (ref 0.1–1.0)
Monocytes Relative: 10 %
Neutro Abs: 6.2 10*3/uL (ref 1.7–7.7)
Neutrophils Relative %: 71 %
Platelet Count: 196 10*3/uL (ref 150–400)
RBC: 4.88 MIL/uL (ref 3.87–5.11)
RDW: 15.6 % — ABNORMAL HIGH (ref 11.5–15.5)
WBC Count: 8.8 10*3/uL (ref 4.0–10.5)
nRBC: 0 % (ref 0.0–0.2)

## 2019-07-28 LAB — CMP (CANCER CENTER ONLY)
ALT: 20 U/L (ref 0–44)
AST: 20 U/L (ref 15–41)
Albumin: 3.9 g/dL (ref 3.5–5.0)
Alkaline Phosphatase: 49 U/L (ref 38–126)
Anion gap: 12 (ref 5–15)
BUN: 13 mg/dL (ref 6–20)
CO2: 26 mmol/L (ref 22–32)
Calcium: 9.3 mg/dL (ref 8.9–10.3)
Chloride: 108 mmol/L (ref 98–111)
Creatinine: 0.69 mg/dL (ref 0.44–1.00)
GFR, Est AFR Am: >60 mL/min (ref >60)
GFR, Estimated: >60 mL/min (ref >60)
Glucose, Bld: 94 mg/dL (ref 70–99)
Potassium: 4.3 mmol/L (ref 3.5–5.1)
Sodium: 146 mmol/L — ABNORMAL HIGH (ref 135–145)
Total Bilirubin: 0.3 mg/dL (ref 0.3–1.2)
Total Protein: 7 g/dL (ref 6.5–8.1)

## 2019-07-28 MED ORDER — HEPARIN SOD (PORK) LOCK FLUSH 100 UNIT/ML IV SOLN
500.0000 [IU] | Freq: Once | INTRAVENOUS | Status: AC | PRN
  Administered 2019-07-28: 500 [IU]
  Filled 2019-07-28: qty 5

## 2019-07-28 MED ORDER — SODIUM CHLORIDE 0.9 % IV SOLN
Freq: Once | INTRAVENOUS | Status: AC
  Filled 2019-07-28: qty 250

## 2019-07-28 MED ORDER — PALONOSETRON HCL INJECTION 0.25 MG/5ML
0.2500 mg | Freq: Once | INTRAVENOUS | Status: AC
  Administered 2019-07-28: 0.25 mg via INTRAVENOUS

## 2019-07-28 MED ORDER — APIXABAN 5 MG PO TABS: 5.0000 mg | ORAL_TABLET | Freq: Every day | ORAL | 1 refills | Status: DC

## 2019-07-28 MED ORDER — FAMOTIDINE IN NACL 20-0.9 MG/50ML-% IV SOLN
20.0000 mg | Freq: Once | INTRAVENOUS | Status: AC
  Administered 2019-07-28: 20 mg via INTRAVENOUS

## 2019-07-28 MED ORDER — DIPHENHYDRAMINE HCL 50 MG/ML IJ SOLN
INTRAMUSCULAR | Status: AC
  Filled 2019-07-28: qty 1

## 2019-07-28 MED ORDER — SODIUM CHLORIDE 0.9% FLUSH
10.0000 mL | INTRAVENOUS | Status: DC | PRN
  Administered 2019-07-28: 10 mL via INTRAVENOUS
  Filled 2019-07-28: qty 10

## 2019-07-28 MED ORDER — SODIUM CHLORIDE 0.9 % IV SOLN
20.0000 mg | Freq: Once | INTRAVENOUS | Status: AC
  Administered 2019-07-28: 20 mg via INTRAVENOUS
  Filled 2019-07-28: qty 20

## 2019-07-28 MED ORDER — SODIUM CHLORIDE 0.9% FLUSH
10.0000 mL | INTRAVENOUS | Status: DC | PRN
  Administered 2019-07-28: 10 mL
  Filled 2019-07-28: qty 10

## 2019-07-28 MED ORDER — SODIUM CHLORIDE 0.9 % IV SOLN
212.8000 mg | Freq: Once | INTRAVENOUS | Status: AC
  Administered 2019-07-28: 210 mg via INTRAVENOUS
  Filled 2019-07-28: qty 21

## 2019-07-28 MED ORDER — FAMOTIDINE IN NACL 20-0.9 MG/50ML-% IV SOLN
INTRAVENOUS | Status: AC
  Filled 2019-07-28: qty 50

## 2019-07-28 MED ORDER — PALONOSETRON HCL INJECTION 0.25 MG/5ML
INTRAVENOUS | Status: AC
  Filled 2019-07-28: qty 5

## 2019-07-28 MED ORDER — SODIUM CHLORIDE 0.9 % IV SOLN
45.0000 mg/m2 | Freq: Once | INTRAVENOUS | Status: AC
  Administered 2019-07-28: 78 mg via INTRAVENOUS
  Filled 2019-07-28: qty 13

## 2019-07-28 MED ORDER — DIPHENHYDRAMINE HCL 50 MG/ML IJ SOLN
50.0000 mg | Freq: Once | INTRAMUSCULAR | Status: AC
  Administered 2019-07-28: 50 mg via INTRAVENOUS

## 2019-07-28 NOTE — Progress Notes (Signed)
Met w/ pt to introduce myself as her Arboriculturist.  Unfortunately there aren't any foundations offering copay assistance for her Dx.  I offered the [employer], went over what it covers, gave her the income requirement and an expense sheet.  She said she was feeling overwhelmed with everything at the moment and she didn't know if she wanted to apply so I gave her my card to contact me if she would like to apply.

## 2019-07-28 NOTE — Progress Notes (Signed)
[facility] Telephone:([phone]   Fax:([phone]) [phone]  OFFICE PROGRESS NOTE  System, Pcp Not In No address on file  DIAGNOSIS: Stage IIIb (T1c, N3, M0) non-small cell lung cancer, adenosquamous carcinoma presented with right upper lobe lung nodule in addition to mediastinal and supraclavicular lymphadenopathy diagnosed in March 2021.  PRIOR THERAPY: None  CURRENT THERAPY: Concurrent chemoradiation with weekly carboplatin for AUC of 2 and paclitaxel 45 NG/M2.  First dose Jul 28, 2019.  INTERVAL HISTORY: Pam Wilson 54 y.o. female returns to the clinic today for returns to the clinic today for follow-up visit.  The patient is feeling fine today with no concerning complaints except for anxiety about starting her treatment today.  She denied having any current chest pain, shortness of breath, cough or hemoptysis.  She denied having any fever or chills.  She has no nausea, vomiting, diarrhea or constipation.  She denied having any headache or visual changes.  Her MRI of the brain was not authorized in [facility] because she already has an order from [facility].  The patient is here today to start the first treatment with concurrent chemoradiation.  MEDICAL HISTORY: Past Medical History:  Diagnosis Date  . Diverticulosis   . Dyslipidemia   . Hashimoto's thyroiditis   . Hypothyroidism   . Internal jugular vein thrombosis, right ([facility]am Wilson)   . Mitral valve prolapse   . Prediabetes     ALLERGIES:  is allergic to sulfamethoxazole-trimethoprim and penicillins.  MEDICATIONS:  Current Outpatient Medications  Medication Sig Dispense Refill  . apixaban (ELIQUIS) 5 MG TABS tablet Take 1 tablet (5 mg total) by mouth daily. 30 tablet 1  . bacitracin ointment Apply topically.    . colesevelam (WELCHOL) 625 MG tablet Take 1,250 mg by mouth daily.    . DENTA 5000 PLUS 1.1 % CREA dental cream BRUSH THOUROGHLY TWICE DAILY DO NOT RINSE EAT OR DRINK AFTERWARD    . EUTHYROX 112 MCG  tablet Take 112 mcg by mouth daily.    Marland Kitchen levothyroxine (SYNTHROID) 112 MCG tablet Take by mouth.    . lidocaine-prilocaine (EMLA) cream Apply to Port-A-Cath site 30-60 minutes before treatment. 30 g 0  . liothyronine (CYTOMEL) 5 MCG tablet Take by mouth.    . metFORMIN (GLUCOPHAGE-XR) 750 MG 24 hr tablet Take by mouth.    . Multiple Vitamin (MULTI-VITAMIN) tablet Take by mouth.    . prochlorperazine (COMPAZINE) 10 MG tablet Take 1 tablet (10 mg total) by mouth every 6 (six) hours as needed for nausea or vomiting. 30 tablet 0  . rosuvastatin (CRESTOR) 5 MG tablet Take 5 mg by mouth daily.     No current facility-administered medications for this visit.    SURGICAL HISTORY: No past surgical history on file.  REVIEW OF SYSTEMS:  Constitutional: negative Eyes: negative Ears, nose, mouth, throat, and face: negative Respiratory: negative Cardiovascular: negative Gastrointestinal: negative Genitourinary:negative Integument/breast: negative Hematologic/lymphatic: negative Musculoskeletal:negative Neurological: negative Behavioral/Psych: positive for anxiety Endocrine: negative Allergic/Immunologic: negative   PHYSICAL EXAMINATION: General appearance: alert, cooperative and no distress Head: Normocephalic, without obvious abnormality, atraumatic Neck: no adenopathy, no JVD, supple, symmetrical, trachea midline and thyroid not enlarged, symmetric, no tenderness/mass/nodules Lymph nodes: Cervical, supraclavicular, and axillary nodes normal. Resp: clear to auscultation bilaterally Back: symmetric, no curvature. ROM normal. No CVA tenderness. Cardio: regular rate and rhythm, S1, S2 normal, no murmur, click, rub or gallop GI: soft, non-tender; bowel sounds normal; no masses,  no organomegaly Extremities: extremities normal, atraumatic, no cyanosis or  edema Neurologic: Alert and oriented X 3, normal strength and tone. Normal symmetric reflexes. Normal coordination and gait  ECOG PERFORMANCE  STATUS: 1 - Symptomatic but completely ambulatory  Blood pressure 122/80, pulse 76, temperature 98.3 F (36.8 C), temperature source Temporal, resp. rate 17, height 5\' 8"  (1.727 m), weight 143 lb 4.8 oz (65 kg), SpO2 98 %.  LABORATORY DATA: Lab Results  Component Value Date   WBC 8.8 07/28/2019   HGB 13.0 07/28/2019   HCT 40.8 07/28/2019   MCV 83.6 07/28/2019   PLT 196 07/28/2019      Chemistry   No results found for: NA, K, CL, CO2, BUN, CREATININE, GLU No results found for: CALCIUM, ALKPHOS, AST, ALT, BILITOT     RADIOGRAPHIC STUDIES: No results found.  ASSESSMENT AND PLAN: This is a very pleasant 54 years old white female recently diagnosed with a stage IIIb non-small cell lung cancer, adenosquamous carcinoma presented with right upper lobe lung nodule in addition to mediastinal and supraclavicular lymphadenopathy diagnosed in March 2021. The staging work-up is not complete because the patient did not have MRI of the brain which is authorized to be done at [facility].  I recommended for her to have her MRI performed there. She did not also have molecular studies after her diagnosis.  I requested it from the pathology department at [facility]. She will proceed with the first treatment with concurrent chemoradiation with carboplatin for AUC of 2 and paclitaxel 45 NG/M2 today. The patient will come back for follow-up visit in 2 weeks for evaluation and management of any adverse effect of her treatment. For the history of pulmonary embolism I will give the patient refill of Eliquis today until she establish care with her primary care physician and transfer her refill to her primary care provider. The patient was advised to call immediately if she has any other concerning symptoms in the interval. The patient voices understanding of current disease status and treatment options and is in agreement with the current care plan.  All questions were answered. The patient knows to call  the clinic with any problems, questions or concerns. We can certainly see the patient much sooner if necessary.   Disclaimer: This note was dictated with voice recognition software. Similar sounding words can inadvertently be transcribed and may not be corrected upon review.

## 2019-07-28 NOTE — Patient Instructions (Addendum)
[facility] Discharge Instructions for Patients Receiving Chemotherapy  Today you received the following chemotherapy agents: Taxol, Carboplatin  To help prevent nausea and vomiting after your treatment, we encourage you to take your nausea medication as directed.   If you develop nausea and vomiting that is not controlled by your nausea medication, call the clinic.   BELOW ARE SYMPTOMS THAT SHOULD BE REPORTED IMMEDIATELY:  *FEVER GREATER THAN 100.5 F  *CHILLS WITH OR WITHOUT FEVER  NAUSEA AND VOMITING THAT IS NOT CONTROLLED WITH YOUR NAUSEA MEDICATION  *UNUSUAL SHORTNESS OF BREATH  *UNUSUAL BRUISING OR BLEEDING  TENDERNESS IN MOUTH AND THROAT WITH OR WITHOUT PRESENCE OF ULCERS  *URINARY PROBLEMS  *BOWEL PROBLEMS  UNUSUAL RASH Items with * indicate a potential emergency and should be followed up as soon as possible.  Feel free to call the clinic should you have any questions or concerns. The clinic phone number is [phone].  Please show the [facility] at check-in to the Emergency Department and triage nurse.  Paclitaxel injection What is this medicine? PACLITAXEL (PAK li TAX el) is a chemotherapy drug. It targets fast dividing cells, like cancer cells, and causes these cells to die. This medicine is used to treat ovarian cancer, breast cancer, lung cancer, Kaposi's sarcoma, and other cancers. This medicine may be used for other purposes; ask your health care provider or pharmacist if you have questions. COMMON BRAND NAME(S): Onxol, Taxol What should I tell my health care provider before I take this medicine? They need to know if you have any of these conditions:  history of irregular heartbeat  liver disease  low blood counts, like low white cell, platelet, or red cell counts  lung or breathing disease, like asthma  tingling of the fingers or toes, or other nerve disorder  an unusual or allergic reaction to paclitaxel, alcohol,  polyoxyethylated castor oil, other chemotherapy, other medicines, foods, dyes, or preservatives  pregnant or trying to get pregnant  breast-feeding How should I use this medicine? This drug is given as an infusion into a vein. It is administered in a hospital or clinic by a specially trained health care professional. Talk to your pediatrician regarding the use of this medicine in children. Special care may be needed. Overdosage: If you think you have taken too much of this medicine contact a poison control center or emergency room at once. NOTE: This medicine is only for you. Do not share this medicine with others. What if I miss a dose? It is important not to miss your dose. Call your doctor or health care professional if you are unable to keep an appointment. What may interact with this medicine? Do not take this medicine with any of the following medications:  disulfiram  metronidazole This medicine may also interact with the following medications:  antiviral medicines for hepatitis, HIV or AIDS  certain antibiotics like erythromycin and clarithromycin  certain medicines for fungal infections like ketoconazole and itraconazole  certain medicines for seizures like carbamazepine, phenobarbital, phenytoin  gemfibrozil  nefazodone  rifampin  St. John's wort This list may not describe all possible interactions. Give your health care provider a list of all the medicines, herbs, non-prescription drugs, or dietary supplements you use. Also tell them if you smoke, drink alcohol, or use illegal drugs. Some items may interact with your medicine. What should I watch for while using this medicine? Your condition will be monitored carefully while you are receiving this medicine. You will need important blood  work done while you are taking this medicine. This medicine can cause serious allergic reactions. To reduce your risk you will need to take other medicine(s) before treatment with this  medicine. If you experience allergic reactions like skin rash, itching or hives, swelling of the face, lips, or tongue, tell your doctor or health care professional right away. In some cases, you may be given additional medicines to help with side effects. Follow all directions for their use. This drug may make you feel generally unwell. This is not uncommon, as chemotherapy can affect healthy cells as well as cancer cells. Report any side effects. Continue your course of treatment even though you feel ill unless your doctor tells you to stop. Call your doctor or health care professional for advice if you get a fever, chills or sore throat, or other symptoms of a cold or flu. Do not treat yourself. This drug decreases your body's ability to fight infections. Try to avoid being around people who are sick. This medicine may increase your risk to bruise or bleed. Call your doctor or health care professional if you notice any unusual bleeding. Be careful brushing and flossing your teeth or using a toothpick because you may get an infection or bleed more easily. If you have any dental work done, tell your dentist you are receiving this medicine. Avoid taking products that contain aspirin, acetaminophen, ibuprofen, naproxen, or ketoprofen unless instructed by your doctor. These medicines may hide a fever. Do not become pregnant while taking this medicine. Women should inform their doctor if they wish to become pregnant or think they might be pregnant. There is a potential for serious side effects to an unborn child. Talk to your health care professional or pharmacist for more information. Do not breast-feed an infant while taking this medicine. Men are advised not to father a child while receiving this medicine. This product may contain alcohol. Ask your pharmacist or healthcare provider if this medicine contains alcohol. Be sure to tell all healthcare providers you are taking this medicine. Certain medicines,  like metronidazole and disulfiram, can cause an unpleasant reaction when taken with alcohol. The reaction includes flushing, headache, nausea, vomiting, sweating, and increased thirst. The reaction can last from 30 minutes to several hours. What side effects may I notice from receiving this medicine? Side effects that you should report to your doctor or health care professional as soon as possible:  allergic reactions like skin rash, itching or hives, swelling of the face, lips, or tongue  breathing problems  changes in vision  fast, irregular heartbeat  high or low blood pressure  mouth sores  pain, tingling, numbness in the hands or feet  signs of decreased platelets or bleeding - bruising, pinpoint red spots on the skin, black, tarry stools, blood in the urine  signs of decreased red blood cells - unusually weak or tired, feeling faint or lightheaded, falls  signs of infection - fever or chills, cough, sore throat, pain or difficulty passing urine  signs and symptoms of liver injury like dark yellow or brown urine; general ill feeling or flu-like symptoms; light-colored stools; loss of appetite; nausea; right upper belly pain; unusually weak or tired; yellowing of the eyes or skin  swelling of the ankles, feet, hands  unusually slow heartbeat Side effects that usually do not require medical attention (report to your doctor or health care professional if they continue or are bothersome):  diarrhea  hair loss  loss of appetite  muscle or joint pain  nausea, vomiting  pain, redness, or irritation at site where injected  tiredness This list may not describe all possible side effects. Call your doctor for medical advice about side effects. You may report side effects to FDA at 1-800-FDA-1088. Where should I keep my medicine? This drug is given in a hospital or clinic and will not be stored at home. NOTE: This sheet is a summary. It may not cover all possible information.  If you have questions about this medicine, talk to your doctor, pharmacist, or health care provider.  2020 Elsevier/Gold Standard (2016-11-14 13:14:55)  Carboplatin injection What is this medicine? CARBOPLATIN (KAR boe pla tin) is a chemotherapy drug. It targets fast dividing cells, like cancer cells, and causes these cells to die. This medicine is used to treat ovarian cancer and many other cancers. This medicine may be used for other purposes; ask your health care provider or pharmacist if you have questions. COMMON BRAND NAME(S): Paraplatin What should I tell my health care provider before I take this medicine? They need to know if you have any of these conditions:  blood disorders  hearing problems  kidney disease  recent or ongoing radiation therapy  an unusual or allergic reaction to carboplatin, cisplatin, other chemotherapy, other medicines, foods, dyes, or preservatives  pregnant or trying to get pregnant  breast-feeding How should I use this medicine? This drug is usually given as an infusion into a vein. It is administered in a hospital or clinic by a specially trained health care professional. Talk to your pediatrician regarding the use of this medicine in children. Special care may be needed. Overdosage: If you think you have taken too much of this medicine contact a poison control center or emergency room at once. NOTE: This medicine is only for you. Do not share this medicine with others. What if I miss a dose? It is important not to miss a dose. Call your doctor or health care professional if you are unable to keep an appointment. What may interact with this medicine?  medicines for seizures  medicines to increase blood counts like filgrastim, pegfilgrastim, sargramostim  some antibiotics like amikacin, gentamicin, neomycin, streptomycin, tobramycin  vaccines Talk to your doctor or health care professional before taking any of these  medicines:  acetaminophen  aspirin  ibuprofen  ketoprofen  naproxen This list may not describe all possible interactions. Give your health care provider a list of all the medicines, herbs, non-prescription drugs, or dietary supplements you use. Also tell them if you smoke, drink alcohol, or use illegal drugs. Some items may interact with your medicine. What should I watch for while using this medicine? Your condition will be monitored carefully while you are receiving this medicine. You will need important blood work done while you are taking this medicine. This drug may make you feel generally unwell. This is not uncommon, as chemotherapy can affect healthy cells as well as cancer cells. Report any side effects. Continue your course of treatment even though you feel ill unless your doctor tells you to stop. In some cases, you may be given additional medicines to help with side effects. Follow all directions for their use. Call your doctor or health care professional for advice if you get a fever, chills or sore throat, or other symptoms of a cold or flu. Do not treat yourself. This drug decreases your body's ability to fight infections. Try to avoid being around people who are sick. This medicine may increase your risk to bruise or bleed.  Call your doctor or health care professional if you notice any unusual bleeding. Be careful brushing and flossing your teeth or using a toothpick because you may get an infection or bleed more easily. If you have any dental work done, tell your dentist you are receiving this medicine. Avoid taking products that contain aspirin, acetaminophen, ibuprofen, naproxen, or ketoprofen unless instructed by your doctor. These medicines may hide a fever. Do not become pregnant while taking this medicine. Women should inform their doctor if they wish to become pregnant or think they might be pregnant. There is a potential for serious side effects to an unborn child. Talk  to your health care professional or pharmacist for more information. Do not breast-feed an infant while taking this medicine. What side effects may I notice from receiving this medicine? Side effects that you should report to your doctor or health care professional as soon as possible:  allergic reactions like skin rash, itching or hives, swelling of the face, lips, or tongue  signs of infection - fever or chills, cough, sore throat, pain or difficulty passing urine  signs of decreased platelets or bleeding - bruising, pinpoint red spots on the skin, black, tarry stools, nosebleeds  signs of decreased red blood cells - unusually weak or tired, fainting spells, lightheadedness  breathing problems  changes in hearing  changes in vision  chest pain  high blood pressure  low blood counts - This drug may decrease the number of white blood cells, red blood cells and platelets. You may be at increased risk for infections and bleeding.  nausea and vomiting  pain, swelling, redness or irritation at the injection site  pain, tingling, numbness in the hands or feet  problems with balance, talking, walking  trouble passing urine or change in the amount of urine Side effects that usually do not require medical attention (report to your doctor or health care professional if they continue or are bothersome):  hair loss  loss of appetite  metallic taste in the mouth or changes in taste This list may not describe all possible side effects. Call your doctor for medical advice about side effects. You may report side effects to FDA at 1-800-FDA-1088. Where should I keep my medicine? This drug is given in a hospital or clinic and will not be stored at home. NOTE: This sheet is a summary. It may not cover all possible information. If you have questions about this medicine, talk to your doctor, pharmacist, or health care provider.  2020 Elsevier/Gold Standard (2007-06-18 14:38:05)

## 2019-07-28 NOTE — Patient Instructions (Signed)

## 2019-07-29 ENCOUNTER — Ambulatory Visit: Payer: 59 | Admitting: Radiation Oncology

## 2019-07-29 ENCOUNTER — Telehealth: Payer: Self-pay | Admitting: *Deleted

## 2019-07-29 ENCOUNTER — Ambulatory Visit
Admission: RE | Admit: 2019-07-29 | Discharge: 2019-07-29 | Disposition: A | Discharge status: Home / Self Care | Payer: 59 | Source: Ambulatory Visit | Attending: Radiation Oncology | Admitting: Radiation Oncology

## 2019-07-29 ENCOUNTER — Telehealth: Payer: Self-pay | Admitting: Medical Oncology

## 2019-07-29 ENCOUNTER — Other Ambulatory Visit: Payer: Self-pay

## 2019-07-29 DIAGNOSIS — I82890 Acute embolism and thrombosis of other specified veins: Secondary | ICD-10-CM

## 2019-07-29 DIAGNOSIS — C3411 Malignant neoplasm of upper lobe, right bronchus or lung: Secondary | ICD-10-CM | POA: Diagnosis not present

## 2019-07-29 MED ORDER — APIXABAN 5 MG PO TABS: 5.0000 mg | ORAL_TABLET | Freq: Two times a day (BID) | ORAL | 1 refills | Status: DC

## 2019-07-29 NOTE — Telephone Encounter (Signed)
Eliquis changed to 5mg  BID.

## 2019-07-29 NOTE — Progress Notes (Signed)
Pharmacist Chemotherapy Monitoring - Follow Up Assessment    I verify that I have reviewed each item in the below checklist:  . Regimen for the patient is scheduled for the appropriate day and plan matches scheduled date. Marland Kitchen Appropriate non-routine labs are ordered dependent on drug ordered. . If applicable, additional medications reviewed and ordered per protocol based on lifetime cumulative doses and/or treatment regimen.   Plan for follow-up and/or issues identified: No . I-vent associated with next due treatment: No . MD and/or nursing notified: No  Sadira Standard D 07/29/2019 12:36 PM

## 2019-07-29 NOTE — Telephone Encounter (Signed)
Per [facility] pharmacy needs dx code 182.C Pt aware of hold up on eloquis authorization. Pt instructed to call number on discount card to activate discount.

## 2019-07-29 NOTE — Telephone Encounter (Signed)
Cannot afford $500 for eloquis. Drug needs PA .  Pt has one tablet left.  Is she eligible for reduced cost at [place]?

## 2019-07-30 ENCOUNTER — Other Ambulatory Visit: Payer: Self-pay | Admitting: Medical Oncology

## 2019-07-30 ENCOUNTER — Telehealth: Payer: Self-pay | Admitting: Medical Oncology

## 2019-07-30 ENCOUNTER — Ambulatory Visit
Admission: RE | Admit: 2019-07-30 | Discharge: 2019-07-30 | Disposition: A | Discharge status: Home / Self Care | Payer: 59 | Source: Ambulatory Visit | Attending: Radiation Oncology | Admitting: Radiation Oncology

## 2019-07-30 ENCOUNTER — Other Ambulatory Visit: Payer: Self-pay

## 2019-07-30 DIAGNOSIS — C3411 Malignant neoplasm of upper lobe, right bronchus or lung: Secondary | ICD-10-CM | POA: Diagnosis not present

## 2019-07-30 NOTE — Telephone Encounter (Signed)
eloquis was authorized bid # [id number]. Pt notified.

## 2019-07-31 ENCOUNTER — Ambulatory Visit
Admission: RE | Admit: 2019-07-31 | Discharge: 2019-07-31 | Disposition: A | Discharge status: Home / Self Care | Payer: 59 | Source: Ambulatory Visit | Attending: Radiation Oncology | Admitting: Radiation Oncology

## 2019-07-31 ENCOUNTER — Other Ambulatory Visit: Payer: Self-pay

## 2019-07-31 DIAGNOSIS — C3411 Malignant neoplasm of upper lobe, right bronchus or lung: Secondary | ICD-10-CM | POA: Diagnosis not present

## 2019-08-01 ENCOUNTER — Ambulatory Visit
Admission: RE | Admit: 2019-08-01 | Discharge: 2019-08-01 | Disposition: A | Discharge status: Home / Self Care | Payer: 59 | Source: Ambulatory Visit | Attending: Radiation Oncology | Admitting: Radiation Oncology

## 2019-08-01 ENCOUNTER — Other Ambulatory Visit: Payer: Self-pay

## 2019-08-01 ENCOUNTER — Telehealth: Payer: Self-pay | Admitting: *Deleted

## 2019-08-01 DIAGNOSIS — C3411 Malignant neoplasm of upper lobe, right bronchus or lung: Secondary | ICD-10-CM

## 2019-08-01 MED ORDER — SONAFINE EX EMUL
1.0000 "application " | Freq: Once | CUTANEOUS | Status: AC
  Administered 2019-08-01: 1 via TOPICAL

## 2019-08-01 NOTE — Telephone Encounter (Signed)
-----   Message from Ardeen Garland, RN sent at 07/29/2019  2:40 PM EDT ----- Regarding: eloquis PA Cost is $500 with savings card. Pharmacist said drug needs PA . Is anybody working on this? Thanks,Diane

## 2019-08-01 NOTE — Telephone Encounter (Signed)
Connected with [employer].  Status of Eliquis prior authorization favorable.    Connected with Nena Alexander with information.  Currently in [place] unable to receive.  This nurse to Manufacturer site to activate Co-pay assist card to text to patient; backed out as patient reports currently on [employer] site.

## 2019-08-01 NOTE — Progress Notes (Signed)

## 2019-08-02 ENCOUNTER — Ambulatory Visit: Payer: 59

## 2019-08-04 ENCOUNTER — Inpatient Hospital Stay: Payer: 59

## 2019-08-04 ENCOUNTER — Ambulatory Visit
Admission: RE | Admit: 2019-08-04 | Discharge: 2019-08-04 | Disposition: A | Discharge status: Home / Self Care | Payer: 59 | Source: Ambulatory Visit | Attending: Radiation Oncology | Admitting: Radiation Oncology

## 2019-08-04 ENCOUNTER — Encounter: Payer: Self-pay | Admitting: Internal Medicine

## 2019-08-04 ENCOUNTER — Other Ambulatory Visit: Payer: Self-pay

## 2019-08-04 VITALS — BP 125/89 | HR 80 | Temp 98.3°F | Resp 18

## 2019-08-04 DIAGNOSIS — Z95828 Presence of other vascular implants and grafts: Secondary | ICD-10-CM

## 2019-08-04 DIAGNOSIS — C3411 Malignant neoplasm of upper lobe, right bronchus or lung: Secondary | ICD-10-CM | POA: Diagnosis not present

## 2019-08-04 DIAGNOSIS — C3491 Malignant neoplasm of unspecified part of right bronchus or lung: Secondary | ICD-10-CM

## 2019-08-04 LAB — CMP (CANCER CENTER ONLY)
ALT: 24 U/L (ref 0–44)
AST: 22 U/L (ref 15–41)
Albumin: 3.8 g/dL (ref 3.5–5.0)
Alkaline Phosphatase: 52 U/L (ref 38–126)
Anion gap: 9 (ref 5–15)
BUN: 13 mg/dL (ref 6–20)
CO2: 26 mmol/L (ref 22–32)
Calcium: 9.2 mg/dL (ref 8.9–10.3)
Chloride: 106 mmol/L (ref 98–111)
Creatinine: 0.68 mg/dL (ref 0.44–1.00)
GFR, Est AFR Am: >60 mL/min (ref >60)
GFR, Estimated: >60 mL/min (ref >60)
Glucose, Bld: 81 mg/dL (ref 70–99)
Potassium: 4.3 mmol/L (ref 3.5–5.1)
Sodium: 141 mmol/L (ref 135–145)
Total Bilirubin: 0.3 mg/dL (ref 0.3–1.2)
Total Protein: 6.8 g/dL (ref 6.5–8.1)

## 2019-08-04 LAB — CBC WITH DIFFERENTIAL (CANCER CENTER ONLY)
Abs Immature Granulocytes: 0.03 10*3/uL (ref 0.00–0.07)
Basophils Absolute: 0.1 10*3/uL (ref 0.0–0.1)
Basophils Relative: 1 %
Eosinophils Absolute: 0.3 10*3/uL (ref 0.0–0.5)
Eosinophils Relative: 4 %
HCT: 40.1 % (ref 36.0–46.0)
Hemoglobin: 12.8 g/dL (ref 12.0–15.0)
Immature Granulocytes: 1 %
Lymphocytes Relative: 10 %
Lymphs Abs: 0.6 10*3/uL — ABNORMAL LOW (ref 0.7–4.0)
MCH: 26.6 pg (ref 26.0–34.0)
MCHC: 31.9 g/dL (ref 30.0–36.0)
MCV: 83.4 fL (ref 80.0–100.0)
Monocytes Absolute: 0.6 10*3/uL (ref 0.1–1.0)
Monocytes Relative: 10 %
Neutro Abs: 4.4 10*3/uL (ref 1.7–7.7)
Neutrophils Relative %: 74 %
Platelet Count: 165 10*3/uL (ref 150–400)
RBC: 4.81 MIL/uL (ref 3.87–5.11)
RDW: 15.3 % (ref 11.5–15.5)
WBC Count: 5.9 10*3/uL (ref 4.0–10.5)
nRBC: 0 % (ref 0.0–0.2)

## 2019-08-04 MED ORDER — SODIUM CHLORIDE 0.9 % IV SOLN
Freq: Once | INTRAVENOUS | Status: AC
  Filled 2019-08-04: qty 250

## 2019-08-04 MED ORDER — SODIUM CHLORIDE 0.9 % IV SOLN
45.0000 mg/m2 | Freq: Once | INTRAVENOUS | Status: AC
  Administered 2019-08-04: 78 mg via INTRAVENOUS
  Filled 2019-08-04: qty 13

## 2019-08-04 MED ORDER — DIPHENHYDRAMINE HCL 50 MG/ML IJ SOLN
INTRAMUSCULAR | Status: AC
  Filled 2019-08-04: qty 1

## 2019-08-04 MED ORDER — FAMOTIDINE IN NACL 20-0.9 MG/50ML-% IV SOLN
20.0000 mg | Freq: Once | INTRAVENOUS | Status: AC
  Administered 2019-08-04: 20 mg via INTRAVENOUS

## 2019-08-04 MED ORDER — SODIUM CHLORIDE 0.9% FLUSH
10.0000 mL | Freq: Once | INTRAVENOUS | Status: AC
  Administered 2019-08-04: 10 mL via INTRAVENOUS
  Filled 2019-08-04: qty 10

## 2019-08-04 MED ORDER — FAMOTIDINE IN NACL 20-0.9 MG/50ML-% IV SOLN
INTRAVENOUS | Status: AC
  Filled 2019-08-04: qty 50

## 2019-08-04 MED ORDER — SODIUM CHLORIDE 0.9 % IV SOLN
212.8000 mg | Freq: Once | INTRAVENOUS | Status: AC
  Administered 2019-08-04: 210 mg via INTRAVENOUS
  Filled 2019-08-04: qty 21

## 2019-08-04 MED ORDER — SODIUM CHLORIDE 0.9% FLUSH
10.0000 mL | INTRAVENOUS | Status: DC | PRN
  Filled 2019-08-04: qty 10

## 2019-08-04 MED ORDER — DIPHENHYDRAMINE HCL 50 MG/ML IJ SOLN
50.0000 mg | Freq: Once | INTRAMUSCULAR | Status: AC
  Administered 2019-08-04: 50 mg via INTRAVENOUS

## 2019-08-04 MED ORDER — PALONOSETRON HCL INJECTION 0.25 MG/5ML
INTRAVENOUS | Status: AC
  Filled 2019-08-04: qty 5

## 2019-08-04 MED ORDER — HEPARIN SOD (PORK) LOCK FLUSH 100 UNIT/ML IV SOLN
500.0000 [IU] | Freq: Once | INTRAVENOUS | Status: AC | PRN
  Administered 2019-08-04: 500 [IU]
  Filled 2019-08-04: qty 5

## 2019-08-04 MED ORDER — SODIUM CHLORIDE 0.9 % IV SOLN
20.0000 mg | Freq: Once | INTRAVENOUS | Status: AC
  Administered 2019-08-04: 20 mg via INTRAVENOUS
  Filled 2019-08-04: qty 20

## 2019-08-04 MED ORDER — PALONOSETRON HCL INJECTION 0.25 MG/5ML
0.2500 mg | Freq: Once | INTRAVENOUS | Status: AC
  Administered 2019-08-04: 0.25 mg via INTRAVENOUS

## 2019-08-04 NOTE — Patient Instructions (Signed)
[facility] Discharge Instructions for Patients Receiving Chemotherapy  Today you received the following chemotherapy agents Taxol, Carboplatin  To help prevent nausea and vomiting after your treatment, we encourage you to take your nausea medication as directed  If you develop nausea and vomiting that is not controlled by your nausea medication, call the clinic.   BELOW ARE SYMPTOMS THAT SHOULD BE REPORTED IMMEDIATELY:  *FEVER GREATER THAN 100.5 F  *CHILLS WITH OR WITHOUT FEVER  NAUSEA AND VOMITING THAT IS NOT CONTROLLED WITH YOUR NAUSEA MEDICATION  *UNUSUAL SHORTNESS OF BREATH  *UNUSUAL BRUISING OR BLEEDING  TENDERNESS IN MOUTH AND THROAT WITH OR WITHOUT PRESENCE OF ULCERS  *URINARY PROBLEMS  *BOWEL PROBLEMS  UNUSUAL RASH Items with * indicate a potential emergency and should be followed up as soon as possible.  Feel free to call the clinic should you have any questions or concerns. The clinic phone number is [phone].  Please show the CHEMO ALERT CARD at check-in to the Emergency Department and triage nurse.   

## 2019-08-05 ENCOUNTER — Other Ambulatory Visit: Payer: Self-pay

## 2019-08-05 ENCOUNTER — Ambulatory Visit
Admission: RE | Admit: 2019-08-05 | Discharge: 2019-08-05 | Disposition: A | Discharge status: Home / Self Care | Payer: 59 | Source: Ambulatory Visit | Attending: Radiation Oncology | Admitting: Radiation Oncology

## 2019-08-05 ENCOUNTER — Telehealth: Payer: Self-pay | Admitting: *Deleted

## 2019-08-05 DIAGNOSIS — C3491 Malignant neoplasm of unspecified part of right bronchus or lung: Secondary | ICD-10-CM

## 2019-08-05 DIAGNOSIS — C3411 Malignant neoplasm of upper lobe, right bronchus or lung: Secondary | ICD-10-CM | POA: Diagnosis not present

## 2019-08-05 NOTE — Telephone Encounter (Signed)
Oncology Nurse Navigator Documentation  Oncology Nurse Navigator Flowsheets 08/05/2019  Diagnosis Status Confirmed Diagnosis Complete  Planned Course of Treatment -  Phase of Treatment -  Navigator Follow Up Date: -  Navigator Follow Up Reason: -  Navigator Location [facility]  Navigator Encounter Type Telephone/I called [facility] path dept to check on molecular testing.  I spoke with several individuals.  I finally got a fax of the results.  Dr. Julien Nordmann is updated and is aware they did not run NGS for lung cancer but they did send PDL 1.  Dr. Julien Nordmann would like me to call patient to update and to order Guardant 360 at next lab drawl. I called and spoke with patient.  I updated.   Telephone Outgoing Call  Treatment Initiated Date 07/28/2019  Patient Visit Type -  Treatment Phase Treatment  Barriers/Navigation Needs Coordination of Care;Education  Education Other  Interventions Coordination of Care;Education;Psycho-Social Support  Acuity Level 2-Minimal Needs (1-2 Barriers Identified)  Coordination of Care Other  Education Method -  Time Spent with Patient 60

## 2019-08-06 ENCOUNTER — Encounter: Payer: Self-pay | Admitting: *Deleted

## 2019-08-06 ENCOUNTER — Ambulatory Visit
Admission: RE | Admit: 2019-08-06 | Discharge: 2019-08-06 | Disposition: A | Discharge status: Home / Self Care | Payer: 59 | Source: Ambulatory Visit | Attending: Radiation Oncology | Admitting: Radiation Oncology

## 2019-08-06 ENCOUNTER — Other Ambulatory Visit: Payer: Self-pay

## 2019-08-06 DIAGNOSIS — C3411 Malignant neoplasm of upper lobe, right bronchus or lung: Secondary | ICD-10-CM | POA: Diagnosis not present

## 2019-08-06 NOTE — Progress Notes (Signed)
Received hard copy of [employer] gen[employer] and PDL 1.  Placed in scan bin.

## 2019-08-07 ENCOUNTER — Other Ambulatory Visit: Payer: Self-pay

## 2019-08-07 ENCOUNTER — Ambulatory Visit
Admission: RE | Admit: 2019-08-07 | Discharge: 2019-08-07 | Disposition: A | Discharge status: Home / Self Care | Payer: 59 | Source: Ambulatory Visit | Attending: Radiation Oncology | Admitting: Radiation Oncology

## 2019-08-07 DIAGNOSIS — C3411 Malignant neoplasm of upper lobe, right bronchus or lung: Secondary | ICD-10-CM | POA: Diagnosis not present

## 2019-08-08 ENCOUNTER — Other Ambulatory Visit: Payer: Self-pay

## 2019-08-08 ENCOUNTER — Ambulatory Visit
Admission: RE | Admit: 2019-08-08 | Discharge: 2019-08-08 | Disposition: A | Discharge status: Home / Self Care | Payer: 59 | Source: Ambulatory Visit | Attending: Radiation Oncology | Admitting: Radiation Oncology

## 2019-08-08 DIAGNOSIS — C3411 Malignant neoplasm of upper lobe, right bronchus or lung: Secondary | ICD-10-CM | POA: Diagnosis not present

## 2019-08-09 ENCOUNTER — Ambulatory Visit: Payer: 59

## 2019-08-11 ENCOUNTER — Ambulatory Visit
Admission: RE | Admit: 2019-08-11 | Discharge: 2019-08-11 | Disposition: A | Discharge status: Home / Self Care | Payer: 59 | Source: Ambulatory Visit | Attending: Radiation Oncology | Admitting: Radiation Oncology

## 2019-08-11 ENCOUNTER — Other Ambulatory Visit: Payer: Self-pay

## 2019-08-11 DIAGNOSIS — C3411 Malignant neoplasm of upper lobe, right bronchus or lung: Secondary | ICD-10-CM | POA: Diagnosis not present

## 2019-08-11 NOTE — Progress Notes (Signed)
[name]  System, Pcp Not In No address on file  DIAGNOSIS: Stage IIIb non-small cell lung cancer, adenosquamous carcinoma.  She presented with a right upper lobe lung nodule in addition to mediastinal and supraclavicular lymphadenopathy.  She was diagnosed in March and 2021.  PDL1: 100%  [facility] Molecular Studies: Pending  PRIOR THERAPY: None  CURRENT THERAPY: Concurrent chemoradiation with carboplatin for an AUC of 2 and paclitaxel 45 mg/m IV every week.  Status post 2 cycles. First dose on 07/28/19  INTERVAL HISTORY: Pam Wilson 54 y.o. female returns to the clinic today for a follow-up visit.  The patient is feeling fairly well today without any concerning complaints. She is currently undergoing treatment with weekly concurrent chemoradiation and she is tolerating this well without any adverse side effects except for nausea. She has not taken her anti-emetic due to being fearful of adverse side effects of medications, such as drowsiness since she is an active person and walks regularly. She tries not to take any medication and prefers other remedies such as peppermint and ginger for her nausea.  She denies any recent fever, chills, night sweats, or weight loss.  She denies any chest pain, shortness of breath, cough, or hemoptysis.  She denies any vomiting or diarrhea.  She denies any headache or visual changes.  The patient is scheduled to complete her last treatment of radiation on 09/08/2019. She had molecular studies performed at [facility], however, the incorrect molecular studies were performed. We will arrange for her to have [facility] 360 testing while in the clinic today to assess for targeted therapy if needed in the future. She never had a her initial staging brain MRI performed due to challenges with her insurance company approving of the MRI at [facility]. She is in this area due to being closer to her daughter.  She is here today for evaluation before  starting cycle #3.  MEDICAL HISTORY: Past Medical History:  Diagnosis Date  . Diverticulosis   . Dyslipidemia   . Hashimoto's thyroiditis   . Hypothyroidism   . Internal jugular vein thrombosis, right ([place])   . Mitral valve prolapse   . Prediabetes     ALLERGIES:  is allergic to sulfamethoxazole-trimethoprim and penicillins.  MEDICATIONS:  Current Outpatient Medications  Medication Sig Dispense Refill  . apixaban (ELIQUIS) 5 MG TABS tablet Take 1 tablet (5 mg total) by mouth 2 (two) times daily. 30 tablet 1  . bacitracin ointment Apply topically.    . colesevelam (WELCHOL) 625 MG tablet Take 1,250 mg by mouth daily.    . DENTA 5000 PLUS 1.1 % CREA dental cream BRUSH THOUROGHLY TWICE DAILY DO NOT RINSE EAT OR DRINK AFTERWARD    . EUTHYROX 112 MCG tablet Take 112 mcg by mouth daily.    Marland Kitchen levothyroxine (SYNTHROID) 112 MCG tablet Take by mouth.    . lidocaine-prilocaine (EMLA) cream Apply to Port-A-Cath site 30-60 minutes before treatment. 30 g 0  . liothyronine (CYTOMEL) 5 MCG tablet Take by mouth.    . metFORMIN (GLUCOPHAGE-XR) 750 MG 24 hr tablet Take by mouth.    . Multiple Vitamin (MULTI-VITAMIN) tablet Take by mouth.    . prochlorperazine (COMPAZINE) 10 MG tablet Take 1 tablet (10 mg total) by mouth every 6 (six) hours as needed for nausea or vomiting. 30 tablet 0  . rosuvastatin (CRESTOR) 5 MG tablet Take 5 mg by mouth daily.     No current facility-administered medications for this visit.  SURGICAL HISTORY: No past surgical history on file.  REVIEW OF SYSTEMS:   Review of Systems  Constitutional: Negative for appetite change, chills, fatigue, fever and unexpected weight change.  HENT: Negative for mouth sores, nosebleeds, sore throat and trouble swallowing.   Eyes: Negative for eye problems and icterus.  Respiratory: Negative for cough, hemoptysis, shortness of breath and wheezing.   Cardiovascular: Negative for chest pain and leg swelling.  Gastrointestinal:  Positive for nausea and constipation. Negative for abdominal pain,  diarrhea, and vomiting.  Genitourinary: Negative for bladder incontinence, difficulty urinating, dysuria, frequency and hematuria.   Musculoskeletal: Negative for back pain, gait problem, neck pain and neck stiffness.  Skin: Negative for itching and rash.  Neurological: Negative for dizziness, extremity weakness, gait problem, headaches, light-headedness and seizures.  Hematological: Negative for adenopathy. Does not bruise/bleed easily.  Psychiatric/Behavioral: Negative for confusion, depression and sleep disturbance. The patient is not nervous/anxious.     PHYSICAL EXAMINATION:  There were no vitals taken for this visit.  ECOG PERFORMANCE STATUS: 1 - Symptomatic but completely ambulatory  Physical Exam  Constitutional: Oriented to person, place, and time and well-developed, well-nourished, and in no distress.  HENT:  Head: Normocephalic and atraumatic.  Mouth/Throat: Oropharynx is clear and moist. No oropharyngeal exudate.  Eyes: Conjunctivae are normal. Right eye exhibits no discharge. Left eye exhibits no discharge. No scleral icterus.  Neck: Normal range of motion. Neck supple.  Cardiovascular: Normal rate, regular rhythm, normal heart sounds and intact distal pulses.   Pulmonary/Chest: Effort normal and breath sounds normal. No respiratory distress. No wheezes. No rales.  Abdominal: Soft. Bowel sounds are normal. Exhibits no distension and no mass. There is no tenderness.  Musculoskeletal: Normal range of motion. Exhibits no edema.  Lymphadenopathy:    No cervical adenopathy.  Neurological: Alert and oriented to person, place, and time. Exhibits normal muscle tone. Gait normal. Coordination normal.  Skin: Skin is warm and dry. No rash noted. Not diaphoretic. No erythema. No pallor.  Psychiatric: Mood, memory and judgment normal.  Vitals reviewed.  LABORATORY DATA: Lab Results  Component Value Date   WBC 5.9  08/04/2019   HGB 12.8 08/04/2019   HCT 40.1 08/04/2019   MCV 83.4 08/04/2019   PLT 165 08/04/2019      Chemistry      Component Value Date/Time   NA 141 08/04/2019 0925   K 4.3 08/04/2019 0925   CL 106 08/04/2019 0925   CO2 26 08/04/2019 0925   BUN 13 08/04/2019 0925   CREATININE 0.68 08/04/2019 0925      Component Value Date/Time   CALCIUM 9.2 08/04/2019 0925   ALKPHOS 52 08/04/2019 0925   AST 22 08/04/2019 0925   ALT 24 08/04/2019 0925   BILITOT 0.3 08/04/2019 0925       RADIOGRAPHIC STUDIES:  No results found.   ASSESSMENT/PLAN:  This is a very pleasant 54 year old Caucasian female recently diagnosed with stage IIIb non-small cell lung cancer, adenosquamous carcinoma.  She presented with a right upper lobe lung nodule in addition to mediastinal and supraclavicular lymphadenopathy.  She was diagnosed in March 2021.  Her PDL 1 expression is 100%. Molecular studies are still pending. She had guardant 360 testing performed which is still pending.   She is currently undergoing weekly concurrent chemoradiation with carboplatin for an AUC of 2 and paclitaxel 45 mg/m.  She is status post 2 cycles and she tolerated well without any adverse side effects.  She is scheduled to receive her last treatment with  radiation on 09/08/2019.  The patient was seen with Dr. Julien Nordmann today.  Labs were reviewed.  Recommend that she proceed with cycle #3 today scheduled.  We will see her back for follow-up visit in 2 weeks for evaluation before starting cycle #5.  She will continue to take Eliquis for her history of a pulmonary embolism.  For her constipation, we discussed being active, drinking plenty of fluids, and eating fruits and vegetables decreases the risk of constipation. She may also use prune juice. Advised that she take stool softner to decrease the risk of constipation.   For her nausea, she will continue with her ginger and peppermint for her nausea as that is working for her. She  will use her anti-emetic if needed if no relief with her other interventions.   Regarding her staging brain MRI, this was approved at [facility]. Unable to get this scan performed at [facility]. Advised the patient to discuss with her insurance company. Advised her to get it done at [facility] if that is the only option. If her insurance will allow her to get her MRI here, we would be happy to arrange for it.   The patient was advised to call immediately if she has any concerning symptoms in the interval. The patient voices understanding of current disease status and treatment options and is in agreement with the current care plan. All questions were answered. The patient knows to call the clinic with any problems, questions or concerns. We can certainly see the patient much sooner if necessary  No orders of the defined types were placed in this encounter.    Shawn Dannenberg L Berkley Wrightsman, PA-C 08/11/19  ADDENDUM: Hematology/Oncology Attending: I had a face-to-face encounter with the patient today.  I recommended her care plan.  This is a very pleasant 54 years old white female with stage IIIb non-small cell lung cancer, adenosquamous carcinoma with PD-L1 expression of 100% but the molecular studies are still pending.  The patient is currently undergoing a course of concurrent chemoradiation with weekly carboplatin and paclitaxel status post 2 cycles.  She has been tolerating her treatment well with no concerning adverse effects. She was unable to get her MRI of the brain done locally because it is authorized to be done at [facility] and her insurance declined to give [place] approval for the procedure to be done locally.  She will reach out to her insurance company to find if they can switch the authorization for [place]. I recommended for the patient to continue her current treatment with concurrent chemoradiation and she will proceed with cycle #3 today. She will come back for follow-up visit in 2 weeks for  evaluation and management of any adverse effect of her treatment. The patient was advised to call immediately if she has any other concerning symptoms in the interval.  Disclaimer: This note was dictated with voice recognition software. Similar sounding words can inadvertently be transcribed and may be missed upon review. Eilleen Kempf, MD 08/12/19

## 2019-08-12 ENCOUNTER — Ambulatory Visit
Admission: RE | Admit: 2019-08-12 | Discharge: 2019-08-12 | Disposition: A | Discharge status: Home / Self Care | Payer: 59 | Source: Ambulatory Visit | Attending: Radiation Oncology | Admitting: Radiation Oncology

## 2019-08-12 ENCOUNTER — Inpatient Hospital Stay: Payer: 59

## 2019-08-12 ENCOUNTER — Inpatient Hospital Stay (HOSPITAL_BASED_OUTPATIENT_CLINIC_OR_DEPARTMENT_OTHER): Payer: 59 | Admitting: Physician Assistant

## 2019-08-12 ENCOUNTER — Other Ambulatory Visit: Payer: Self-pay

## 2019-08-12 ENCOUNTER — Encounter: Payer: Self-pay | Admitting: Internal Medicine

## 2019-08-12 ENCOUNTER — Encounter: Payer: Self-pay | Admitting: Physician Assistant

## 2019-08-12 VITALS — BP 125/85 | HR 87 | Temp 98.3°F | Resp 17 | Ht 68.0 in | Wt 148.0 lb

## 2019-08-12 DIAGNOSIS — C3491 Malignant neoplasm of unspecified part of right bronchus or lung: Secondary | ICD-10-CM

## 2019-08-12 DIAGNOSIS — C3411 Malignant neoplasm of upper lobe, right bronchus or lung: Secondary | ICD-10-CM

## 2019-08-12 DIAGNOSIS — Z5111 Encounter for antineoplastic chemotherapy: Secondary | ICD-10-CM | POA: Diagnosis not present

## 2019-08-12 LAB — CBC WITH DIFFERENTIAL (CANCER CENTER ONLY)
Abs Immature Granulocytes: 0.02 10*3/uL (ref 0.00–0.07)
Basophils Absolute: 0 10*3/uL (ref 0.0–0.1)
Basophils Relative: 1 %
Eosinophils Absolute: 0.2 10*3/uL (ref 0.0–0.5)
Eosinophils Relative: 2 %
HCT: 41 % (ref 36.0–46.0)
Hemoglobin: 13 g/dL (ref 12.0–15.0)
Immature Granulocytes: 0 %
Lymphocytes Relative: 7 %
Lymphs Abs: 0.4 10*3/uL — ABNORMAL LOW (ref 0.7–4.0)
MCH: 26.6 pg (ref 26.0–34.0)
MCHC: 31.7 g/dL (ref 30.0–36.0)
MCV: 84 fL (ref 80.0–100.0)
Monocytes Absolute: 0.9 10*3/uL (ref 0.1–1.0)
Monocytes Relative: 14 %
Neutro Abs: 4.7 10*3/uL (ref 1.7–7.7)
Neutrophils Relative %: 76 %
Platelet Count: 160 10*3/uL (ref 150–400)
RBC: 4.88 MIL/uL (ref 3.87–5.11)
RDW: 15.9 % — ABNORMAL HIGH (ref 11.5–15.5)
WBC Count: 6.2 10*3/uL (ref 4.0–10.5)
nRBC: 0 % (ref 0.0–0.2)

## 2019-08-12 LAB — CMP (CANCER CENTER ONLY)
ALT: 33 U/L (ref 0–44)
AST: 22 U/L (ref 15–41)
Albumin: 4 g/dL (ref 3.5–5.0)
Alkaline Phosphatase: 61 U/L (ref 38–126)
Anion gap: 11 (ref 5–15)
BUN: 12 mg/dL (ref 6–20)
CO2: 27 mmol/L (ref 22–32)
Calcium: 9.5 mg/dL (ref 8.9–10.3)
Chloride: 103 mmol/L (ref 98–111)
Creatinine: 0.73 mg/dL (ref 0.44–1.00)
GFR, Est AFR Am: >60 mL/min (ref >60)
GFR, Estimated: >60 mL/min (ref >60)
Glucose, Bld: 83 mg/dL (ref 70–99)
Potassium: 4.7 mmol/L (ref 3.5–5.1)
Sodium: 141 mmol/L (ref 135–145)
Total Bilirubin: 0.3 mg/dL (ref 0.3–1.2)
Total Protein: 7.3 g/dL (ref 6.5–8.1)

## 2019-08-12 MED ORDER — SODIUM CHLORIDE 0.9 % IV SOLN
Freq: Once | INTRAVENOUS | Status: AC
  Filled 2019-08-12: qty 250

## 2019-08-12 MED ORDER — PALONOSETRON HCL INJECTION 0.25 MG/5ML
0.2500 mg | Freq: Once | INTRAVENOUS | Status: AC
  Administered 2019-08-12: 0.25 mg via INTRAVENOUS

## 2019-08-12 MED ORDER — FAMOTIDINE IN NACL 20-0.9 MG/50ML-% IV SOLN
20.0000 mg | Freq: Once | INTRAVENOUS | Status: AC
  Administered 2019-08-12: 20 mg via INTRAVENOUS

## 2019-08-12 MED ORDER — HEPARIN SOD (PORK) LOCK FLUSH 100 UNIT/ML IV SOLN
500.0000 [IU] | Freq: Once | INTRAVENOUS | Status: AC | PRN
  Administered 2019-08-12: 500 [IU]
  Filled 2019-08-12: qty 5

## 2019-08-12 MED ORDER — SODIUM CHLORIDE 0.9% FLUSH
10.0000 mL | INTRAVENOUS | Status: DC | PRN
  Administered 2019-08-12: 10 mL
  Filled 2019-08-12: qty 10

## 2019-08-12 MED ORDER — PALONOSETRON HCL INJECTION 0.25 MG/5ML
INTRAVENOUS | Status: AC
  Filled 2019-08-12: qty 5

## 2019-08-12 MED ORDER — SODIUM CHLORIDE 0.9 % IV SOLN
20.0000 mg | Freq: Once | INTRAVENOUS | Status: AC
  Administered 2019-08-12: 20 mg via INTRAVENOUS
  Filled 2019-08-12: qty 20

## 2019-08-12 MED ORDER — DIPHENHYDRAMINE HCL 50 MG/ML IJ SOLN
50.0000 mg | Freq: Once | INTRAMUSCULAR | Status: AC
  Administered 2019-08-12: 50 mg via INTRAVENOUS

## 2019-08-12 MED ORDER — DIPHENHYDRAMINE HCL 50 MG/ML IJ SOLN
INTRAMUSCULAR | Status: AC
  Filled 2019-08-12: qty 1

## 2019-08-12 MED ORDER — SODIUM CHLORIDE 0.9 % IV SOLN
45.0000 mg/m2 | Freq: Once | INTRAVENOUS | Status: AC
  Administered 2019-08-12: 78 mg via INTRAVENOUS
  Filled 2019-08-12: qty 13

## 2019-08-12 MED ORDER — FAMOTIDINE IN NACL 20-0.9 MG/50ML-% IV SOLN
INTRAVENOUS | Status: AC
  Filled 2019-08-12: qty 50

## 2019-08-12 MED ORDER — SODIUM CHLORIDE 0.9 % IV SOLN
212.8000 mg | Freq: Once | INTRAVENOUS | Status: AC
  Administered 2019-08-12: 210 mg via INTRAVENOUS
  Filled 2019-08-12: qty 21

## 2019-08-12 NOTE — Progress Notes (Signed)
Pharmacist Chemotherapy Monitoring - Follow Up Assessment    I verify that I have reviewed each item in the below checklist:  . Regimen for the patient is scheduled for the appropriate day and plan matches scheduled date. Marland Kitchen Appropriate non-routine labs are ordered dependent on drug ordered. . If applicable, additional medications reviewed and ordered per protocol based on lifetime cumulative doses and/or treatment regimen.   Plan for follow-up and/or issues identified: No . I-vent associated with next due treatment: No . MD and/or nursing notified: No  Pam Wilson 08/12/2019 12:13 PM

## 2019-08-12 NOTE — Patient Instructions (Signed)
[facility] Discharge Instructions for Patients Receiving Chemotherapy  Today you received the following chemotherapy agents Taxol, Carboplatin  To help prevent nausea and vomiting after your treatment, we encourage you to take your nausea medication as directed  If you develop nausea and vomiting that is not controlled by your nausea medication, call the clinic.   BELOW ARE SYMPTOMS THAT SHOULD BE REPORTED IMMEDIATELY:  *FEVER GREATER THAN 100.5 F  *CHILLS WITH OR WITHOUT FEVER  NAUSEA AND VOMITING THAT IS NOT CONTROLLED WITH YOUR NAUSEA MEDICATION  *UNUSUAL SHORTNESS OF BREATH  *UNUSUAL BRUISING OR BLEEDING  TENDERNESS IN MOUTH AND THROAT WITH OR WITHOUT PRESENCE OF ULCERS  *URINARY PROBLEMS  *BOWEL PROBLEMS  UNUSUAL RASH Items with * indicate a potential emergency and should be followed up as soon as possible.  Feel free to call the clinic should you have any questions or concerns. The clinic phone number is [phone].  Please show the CHEMO ALERT CARD at check-in to the Emergency Department and triage nurse.   

## 2019-08-13 ENCOUNTER — Ambulatory Visit
Admission: RE | Admit: 2019-08-13 | Discharge: 2019-08-13 | Disposition: A | Discharge status: Home / Self Care | Payer: 59 | Source: Ambulatory Visit | Attending: Radiation Oncology | Admitting: Radiation Oncology

## 2019-08-13 ENCOUNTER — Other Ambulatory Visit: Payer: Self-pay

## 2019-08-13 DIAGNOSIS — C3411 Malignant neoplasm of upper lobe, right bronchus or lung: Secondary | ICD-10-CM | POA: Diagnosis not present

## 2019-08-14 ENCOUNTER — Telehealth: Payer: Self-pay | Admitting: Internal Medicine

## 2019-08-14 ENCOUNTER — Telehealth: Payer: Self-pay | Admitting: Medical Oncology

## 2019-08-14 ENCOUNTER — Other Ambulatory Visit: Payer: Self-pay

## 2019-08-14 ENCOUNTER — Ambulatory Visit
Admission: RE | Admit: 2019-08-14 | Discharge: 2019-08-14 | Disposition: A | Discharge status: Home / Self Care | Payer: 59 | Source: Ambulatory Visit | Attending: Radiation Oncology | Admitting: Radiation Oncology

## 2019-08-14 ENCOUNTER — Encounter: Payer: Self-pay | Admitting: Internal Medicine

## 2019-08-14 DIAGNOSIS — C3411 Malignant neoplasm of upper lobe, right bronchus or lung: Secondary | ICD-10-CM | POA: Diagnosis not present

## 2019-08-14 NOTE — Telephone Encounter (Signed)
Needs cell type - I told [facility] she has adenosquamous.

## 2019-08-14 NOTE — Telephone Encounter (Signed)
Scheduled per 5/18 los. Noted to give pt updated calendar.

## 2019-08-15 ENCOUNTER — Ambulatory Visit
Admission: RE | Admit: 2019-08-15 | Discharge: 2019-08-15 | Disposition: A | Discharge status: Home / Self Care | Payer: 59 | Source: Ambulatory Visit | Attending: Radiation Oncology | Admitting: Radiation Oncology

## 2019-08-15 ENCOUNTER — Other Ambulatory Visit: Payer: Self-pay | Admitting: Radiation Oncology

## 2019-08-15 DIAGNOSIS — C3411 Malignant neoplasm of upper lobe, right bronchus or lung: Secondary | ICD-10-CM | POA: Diagnosis not present

## 2019-08-15 MED ORDER — SUCRALFATE 1 G PO TABS
1.0000 g | ORAL_TABLET | Freq: Four times a day (QID) | ORAL | 2 refills | Status: DC
Start: 2019-08-15 — End: 2021-01-27

## 2019-08-18 ENCOUNTER — Other Ambulatory Visit: Payer: Self-pay

## 2019-08-18 ENCOUNTER — Other Ambulatory Visit: Payer: PRIVATE HEALTH INSURANCE

## 2019-08-18 ENCOUNTER — Inpatient Hospital Stay: Payer: 59

## 2019-08-18 ENCOUNTER — Ambulatory Visit
Admission: RE | Admit: 2019-08-18 | Discharge: 2019-08-18 | Disposition: A | Discharge status: Home / Self Care | Payer: 59 | Source: Ambulatory Visit | Attending: Radiation Oncology | Admitting: Radiation Oncology

## 2019-08-18 ENCOUNTER — Encounter: Payer: Self-pay | Admitting: Internal Medicine

## 2019-08-18 VITALS — BP 132/82 | HR 85 | Temp 98.5°F | Resp 17 | Wt 145.2 lb

## 2019-08-18 DIAGNOSIS — C3411 Malignant neoplasm of upper lobe, right bronchus or lung: Secondary | ICD-10-CM

## 2019-08-18 DIAGNOSIS — Z95828 Presence of other vascular implants and grafts: Secondary | ICD-10-CM

## 2019-08-18 DIAGNOSIS — C3491 Malignant neoplasm of unspecified part of right bronchus or lung: Secondary | ICD-10-CM

## 2019-08-18 LAB — CMP (CANCER CENTER ONLY)
ALT: 25 U/L (ref 0–44)
AST: 21 U/L (ref 15–41)
Albumin: 3.8 g/dL (ref 3.5–5.0)
Alkaline Phosphatase: 50 U/L (ref 38–126)
Anion gap: 9 (ref 5–15)
BUN: 13 mg/dL (ref 6–20)
CO2: 28 mmol/L (ref 22–32)
Calcium: 9.6 mg/dL (ref 8.9–10.3)
Chloride: 106 mmol/L (ref 98–111)
Creatinine: 0.66 mg/dL (ref 0.44–1.00)
GFR, Est AFR Am: >60 mL/min (ref >60)
GFR, Estimated: >60 mL/min (ref >60)
Glucose, Bld: 68 mg/dL — ABNORMAL LOW (ref 70–99)
Potassium: 4.3 mmol/L (ref 3.5–5.1)
Sodium: 143 mmol/L (ref 135–145)
Total Bilirubin: 0.4 mg/dL (ref 0.3–1.2)
Total Protein: 6.8 g/dL (ref 6.5–8.1)

## 2019-08-18 LAB — CBC WITH DIFFERENTIAL (CANCER CENTER ONLY)
Abs Immature Granulocytes: 0.02 10*3/uL (ref 0.00–0.07)
Basophils Absolute: 0 10*3/uL (ref 0.0–0.1)
Basophils Relative: 1 %
Eosinophils Absolute: 0.1 10*3/uL (ref 0.0–0.5)
Eosinophils Relative: 2 %
HCT: 38.8 % (ref 36.0–46.0)
Hemoglobin: 12.7 g/dL (ref 12.0–15.0)
Immature Granulocytes: 0 %
Lymphocytes Relative: 4 %
Lymphs Abs: 0.2 10*3/uL — ABNORMAL LOW (ref 0.7–4.0)
MCH: 27.1 pg (ref 26.0–34.0)
MCHC: 32.7 g/dL (ref 30.0–36.0)
MCV: 82.7 fL (ref 80.0–100.0)
Monocytes Absolute: 0.5 10*3/uL (ref 0.1–1.0)
Monocytes Relative: 9 %
Neutro Abs: 4.5 10*3/uL (ref 1.7–7.7)
Neutrophils Relative %: 84 %
Platelet Count: 145 10*3/uL — ABNORMAL LOW (ref 150–400)
RBC: 4.69 MIL/uL (ref 3.87–5.11)
RDW: 15.7 % — ABNORMAL HIGH (ref 11.5–15.5)
WBC Count: 5.4 10*3/uL (ref 4.0–10.5)
nRBC: 0 % (ref 0.0–0.2)

## 2019-08-18 MED ORDER — FAMOTIDINE IN NACL 20-0.9 MG/50ML-% IV SOLN
20.0000 mg | Freq: Once | INTRAVENOUS | Status: AC
  Administered 2019-08-18: 20 mg via INTRAVENOUS

## 2019-08-18 MED ORDER — HEPARIN SOD (PORK) LOCK FLUSH 100 UNIT/ML IV SOLN
500.0000 [IU] | Freq: Once | INTRAVENOUS | Status: AC | PRN
  Administered 2019-08-18: 500 [IU]
  Filled 2019-08-18: qty 5

## 2019-08-18 MED ORDER — DIPHENHYDRAMINE HCL 50 MG/ML IJ SOLN
INTRAMUSCULAR | Status: AC
  Filled 2019-08-18: qty 1

## 2019-08-18 MED ORDER — SODIUM CHLORIDE 0.9% FLUSH
10.0000 mL | INTRAVENOUS | Status: DC | PRN
  Administered 2019-08-18 (×2): 10 mL via INTRAVENOUS
  Filled 2019-08-18: qty 10

## 2019-08-18 MED ORDER — SODIUM CHLORIDE 0.9% FLUSH
10.0000 mL | INTRAVENOUS | Status: DC | PRN
  Filled 2019-08-18: qty 10

## 2019-08-18 MED ORDER — DIPHENHYDRAMINE HCL 50 MG/ML IJ SOLN
50.0000 mg | Freq: Once | INTRAMUSCULAR | Status: AC
  Administered 2019-08-18: 50 mg via INTRAVENOUS

## 2019-08-18 MED ORDER — SODIUM CHLORIDE 0.9 % IV SOLN
20.0000 mg | Freq: Once | INTRAVENOUS | Status: AC
  Administered 2019-08-18: 20 mg via INTRAVENOUS
  Filled 2019-08-18: qty 20

## 2019-08-18 MED ORDER — PALONOSETRON HCL INJECTION 0.25 MG/5ML
0.2500 mg | Freq: Once | INTRAVENOUS | Status: AC
  Administered 2019-08-18: 0.25 mg via INTRAVENOUS

## 2019-08-18 MED ORDER — FAMOTIDINE IN NACL 20-0.9 MG/50ML-% IV SOLN
INTRAVENOUS | Status: AC
  Filled 2019-08-18: qty 50

## 2019-08-18 MED ORDER — SODIUM CHLORIDE 0.9 % IV SOLN
Freq: Once | INTRAVENOUS | Status: AC
  Filled 2019-08-18: qty 250

## 2019-08-18 MED ORDER — SODIUM CHLORIDE 0.9 % IV SOLN
45.0000 mg/m2 | Freq: Once | INTRAVENOUS | Status: AC
  Administered 2019-08-18: 78 mg via INTRAVENOUS
  Filled 2019-08-18: qty 13

## 2019-08-18 MED ORDER — SODIUM CHLORIDE 0.9 % IV SOLN
212.8000 mg | Freq: Once | INTRAVENOUS | Status: AC
  Administered 2019-08-18: 210 mg via INTRAVENOUS
  Filled 2019-08-18: qty 21

## 2019-08-18 MED ORDER — PALONOSETRON HCL INJECTION 0.25 MG/5ML
INTRAVENOUS | Status: AC
  Filled 2019-08-18: qty 5

## 2019-08-18 NOTE — Patient Instructions (Signed)
[facility] Discharge Instructions for Patients Receiving Chemotherapy  Today you received the following chemotherapy agents: Taxol/Carbo  To help prevent nausea and vomiting after your treatment, we encourage you to take your nausea medication as directed.   If you develop nausea and vomiting that is not controlled by your nausea medication, call the clinic.   BELOW ARE SYMPTOMS THAT SHOULD BE REPORTED IMMEDIATELY:  *FEVER GREATER THAN 100.5 F  *CHILLS WITH OR WITHOUT FEVER  NAUSEA AND VOMITING THAT IS NOT CONTROLLED WITH YOUR NAUSEA MEDICATION  *UNUSUAL SHORTNESS OF BREATH  *UNUSUAL BRUISING OR BLEEDING  TENDERNESS IN MOUTH AND THROAT WITH OR WITHOUT PRESENCE OF ULCERS  *URINARY PROBLEMS  *BOWEL PROBLEMS  UNUSUAL RASH Items with * indicate a potential emergency and should be followed up as soon as possible.  Feel free to call the clinic should you have any questions or concerns. The clinic phone number is [phone].  Please show the [place] at check-in to the Emergency Department and triage nurse.

## 2019-08-18 NOTE — Patient Instructions (Signed)

## 2019-08-19 ENCOUNTER — Encounter: Payer: Self-pay | Admitting: Internal Medicine

## 2019-08-19 ENCOUNTER — Ambulatory Visit
Admission: RE | Admit: 2019-08-19 | Discharge: 2019-08-19 | Disposition: A | Discharge status: Home / Self Care | Payer: 59 | Source: Ambulatory Visit | Attending: Radiation Oncology | Admitting: Radiation Oncology

## 2019-08-19 DIAGNOSIS — C3411 Malignant neoplasm of upper lobe, right bronchus or lung: Secondary | ICD-10-CM | POA: Diagnosis not present

## 2019-08-19 NOTE — Progress Notes (Signed)
Pharmacist Chemotherapy Monitoring - Follow Up Assessment    I verify that I have reviewed each item in the below checklist:  . Regimen for the patient is scheduled for the appropriate day and plan matches scheduled date. Marland Kitchen Appropriate non-routine labs are ordered dependent on drug ordered. . If applicable, additional medications reviewed and ordered per protocol based on lifetime cumulative doses and/or treatment regimen.   Plan for follow-up and/or issues identified: No . I-vent associated with next due treatment: No . MD and/or nursing notified: No  Pam Wilson K 08/19/2019 3:31 PM

## 2019-08-20 ENCOUNTER — Ambulatory Visit
Admission: RE | Admit: 2019-08-20 | Discharge: 2019-08-20 | Disposition: A | Discharge status: Home / Self Care | Payer: 59 | Source: Ambulatory Visit | Attending: Radiation Oncology | Admitting: Radiation Oncology

## 2019-08-20 ENCOUNTER — Other Ambulatory Visit: Payer: Self-pay

## 2019-08-20 DIAGNOSIS — C3411 Malignant neoplasm of upper lobe, right bronchus or lung: Secondary | ICD-10-CM | POA: Diagnosis not present

## 2019-08-21 ENCOUNTER — Ambulatory Visit (HOSPITAL_COMMUNITY): Admission: RE | Admit: 2019-08-21 | Payer: 59 | Source: Ambulatory Visit

## 2019-08-21 ENCOUNTER — Ambulatory Visit
Admission: RE | Admit: 2019-08-21 | Discharge: 2019-08-21 | Disposition: A | Discharge status: Home / Self Care | Payer: 59 | Source: Ambulatory Visit | Attending: Radiation Oncology | Admitting: Radiation Oncology

## 2019-08-21 ENCOUNTER — Other Ambulatory Visit: Payer: Self-pay

## 2019-08-21 ENCOUNTER — Telehealth: Payer: Self-pay | Admitting: Radiation Oncology

## 2019-08-21 ENCOUNTER — Ambulatory Visit (HOSPITAL_COMMUNITY)
Admission: RE | Admit: 2019-08-21 | Discharge: 2019-08-21 | Disposition: A | Discharge status: Home / Self Care | Payer: 59 | Source: Ambulatory Visit | Attending: Radiation Oncology | Admitting: Radiation Oncology

## 2019-08-21 DIAGNOSIS — C3490 Malignant neoplasm of unspecified part of unspecified bronchus or lung: Secondary | ICD-10-CM

## 2019-08-21 MED ORDER — GADOBUTROL 1 MMOL/ML IV SOLN
7.0000 mL | Freq: Once | INTRAVENOUS | Status: AC | PRN
  Administered 2019-08-21: 7 mL via INTRAVENOUS

## 2019-08-21 NOTE — Telephone Encounter (Signed)
I called and spoke with the patient and her daughter by phone. She was glad to finally be able to have her screening MRI of the brain. Unfortunately the delay in getting this was due to insurance authorization. Her MRI was reviewed by Dr. Lisbeth Renshaw and myself. Unfortunately there is a 3 mm left frontal metastasis seen on today's MRI scan. 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 Lisbeth Renshaw would like to complete her chemoRT to the chest, and offer stereotactic radiosurgery [facility]) to the brain in a single fraction. We discussed the logistics of needing a 3T MRI scan, simulation, and evaluation with neurosurgery prior to this therapy. We discussed the single fraction therapy, and the need to make a mask for delivery of this treatment. We reviewed the risks, benefits, short, and long term effects of treatment and the patient is interested in remaining aggressive with her medical care, and would like to proceed as soon as possible. I will notify our brain oncology navigator and ask her to meet with the patient following her treatment to the chest tomorrow morning. She was also willing to sign written consent for [facility] tomorrow when she's seen for her under treatment visit. I'll print out her MRI report so she has this as well and notify Dr. Thad Ranger regarding our plans.     Carola Rhine, PAC

## 2019-08-22 ENCOUNTER — Other Ambulatory Visit: Payer: Self-pay

## 2019-08-22 ENCOUNTER — Ambulatory Visit
Admission: RE | Admit: 2019-08-22 | Discharge: 2019-08-22 | Disposition: A | Discharge status: Home / Self Care | Payer: 59 | Source: Ambulatory Visit | Attending: Radiation Oncology | Admitting: Radiation Oncology

## 2019-08-22 ENCOUNTER — Other Ambulatory Visit: Payer: Self-pay | Admitting: Radiation Therapy

## 2019-08-22 ENCOUNTER — Telehealth: Payer: Self-pay | Admitting: Medical Oncology

## 2019-08-22 DIAGNOSIS — C7949 Secondary malignant neoplasm of other parts of nervous system: Secondary | ICD-10-CM

## 2019-08-22 DIAGNOSIS — C3411 Malignant neoplasm of upper lobe, right bronchus or lung: Secondary | ICD-10-CM | POA: Diagnosis not present

## 2019-08-22 DIAGNOSIS — C7931 Secondary malignant neoplasm of brain: Secondary | ICD-10-CM

## 2019-08-22 NOTE — Progress Notes (Signed)
Orders placed for port access at GI the day of brain MRI.  Manuela Schwartz

## 2019-08-22 NOTE — Telephone Encounter (Signed)
Results delayed-[facility] lab is adding more quality test and "report  will be available next week" . June 1st appt with Cassie and infusion.

## 2019-08-23 NOTE — Telephone Encounter (Signed)
I already received the report.  She has no actionable mutations.  Thank you.

## 2019-08-25 NOTE — Progress Notes (Signed)
[place] OFFICE PROGRESS NOTE  System, Pcp Not In No address on file  DIAGNOSIS: Stage IV non-small cell lung cancer, adenosquamous carcinoma.  She presented with a right upper lobe lung nodule in addition to mediastinal and supraclavicular lymphadenopathy.  She was diagnosed in March and 2021. 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   PDL1: 100%  Guardant 360 Results: KRASG12V 0.2%  Binimetinib  PRIOR THERAPY: None  CURRENT THERAPY: Concurrent chemoradiation with carboplatin for an AUC of 2 and paclitaxel 45 mg/m IV every week.  Status post 4 cycles. First dose on 07/28/19  INTERVAL HISTORY: Pam Wilson 54 y.o. female returns the the clinic today for a follow up visit. The patient is feeling well today without any concerning complaints. In the interval since her last appointment, the patient was able to get her staging brain MRI completed. There was a delay secondary to insurance authorization. Unfortunately, there is a 3 mm metastatic lesion in the left frontal lobe. Dr. Vincent Gros in radiation oncology are planning on completing her weekly concurrent chemoradiation as planned and then proceeding with SRS treatment to the brain lesion. She is scheduled to receive her last radiation treatment on 09/08/2019. She is having some challenges adjusting to this new development. She reached out to the counseling service here over the weekend.   Regarding her weekly chemotherapy. She is tolerating it fairly well except for mild nausea. She is managing her nausea with other remedies such as ginger and pepermint instead of her anti-nausea medication due to not liking to take medication. She did need to take a few tablets of her anti-emetic over the course of this past week which successfully managed her nausea. The patient remains very active physically with exercising at the [employer] park. She denies any recent fevers. She lost a few pounds since her  last appointment. Her appetite comes and goes secondary to nausea. She reports night sweats. She denies any chest pain, shortness of breath, or hemoptysis but endorses a mild dry cough.  She denies any vomiting or diarrhea. Her constipation is improved as well.  She denies any headache or visual changes. [place] and has some questions regarding her diagnosis and if she is able to work. She is here today for evaluation before starting cycle #5.   MEDICAL HISTORY: Past Medical History:  Diagnosis Date  . Diverticulosis   . Dyslipidemia   . Hashimoto's thyroiditis   . Hypothyroidism   . Internal jugular vein thrombosis, right ([place])   . Mitral valve prolapse   . Prediabetes     ALLERGIES:  is allergic to sulfamethoxazole-trimethoprim and penicillins.  MEDICATIONS:  Current Outpatient Medications  Medication Sig Dispense Refill  . apixaban (ELIQUIS) 5 MG TABS tablet Take 1 tablet (5 mg total) by mouth 2 (two) times daily. 30 tablet 1  . bacitracin ointment Apply topically.    . colesevelam (WELCHOL) 625 MG tablet Take 1,250 mg by mouth daily.    . DENTA 5000 PLUS 1.1 % CREA dental cream BRUSH THOUROGHLY TWICE DAILY DO NOT RINSE EAT OR DRINK AFTERWARD    . EUTHYROX 112 MCG tablet Take 112 mcg by mouth daily.    Marland Kitchen levothyroxine (SYNTHROID) 112 MCG tablet Take by mouth.    . lidocaine-prilocaine (EMLA) cream Apply to Port-A-Cath site 30-60 minutes before treatment. 30 g 0  . liothyronine (CYTOMEL) 5 MCG tablet Take by mouth.    . metFORMIN (  GLUCOPHAGE-XR) 750 MG 24 hr tablet Take by mouth.    . Multiple Vitamin (MULTI-VITAMIN) tablet Take by mouth.    . NON FORMULARY Take 4 mLs by mouth daily. Naloxone Liquid for Hashimotos    . prochlorperazine (COMPAZINE) 10 MG tablet Take 1 tablet (10 mg total) by mouth every 6 (six) hours as needed for nausea or vomiting. 30 tablet 0  . rosuvastatin (CRESTOR) 5 MG tablet Take 5 mg by mouth daily.    . sucralfate (CARAFATE)  1 g tablet Take 1 tablet (1 g total) by mouth 4 (four) times daily. Dissolve each tablet in 15 cc water before use. 120 tablet 2   No current facility-administered medications for this visit.   Facility-Administered Medications Ordered in Other Visits  Medication Dose Route Frequency Provider Last Rate Last Admin  . CARBOplatin (PARAPLATIN) 210 mg in sodium chloride 0.9 % 250 mL chemo infusion  210 mg Intravenous Once Curt Bears, MD 542 mL/hr at 08/26/19 1332 210 mg at 08/26/19 1332  . heparin lock flush 100 unit/mL  500 Units Intracatheter Once PRN Curt Bears, MD      . sodium chloride flush (NS) 0.9 % injection 10 mL  10 mL Intracatheter PRN Curt Bears, MD        SURGICAL HISTORY: No past surgical history on file.  REVIEW OF SYSTEMS:   Review of Systems  Constitutional: Positive for mild weight loss. Negative for appetite change, chills, fatigue, and fever.  HENT: Negative for mouth sores, nosebleeds, sore throat and trouble swallowing.   Eyes: Negative for eye problems and icterus.  Respiratory: Positive for mild dry cough. Negative for hemoptysis, shortness of breath and wheezing.   Cardiovascular: Negative for chest pain and leg swelling.  Gastrointestinal: Positive for nausea. Negative for abdominal pain, constipation, diarrhea,  and vomiting.  Genitourinary: Negative for bladder incontinence, difficulty urinating, dysuria, frequency and hematuria.   Musculoskeletal: Negative for back pain, gait problem, neck pain and neck stiffness.  Skin: Negative for itching and rash.  Neurological: Negative for dizziness, extremity weakness, gait problem, headaches, light-headedness and seizures.  Hematological: Negative for adenopathy. Does not bruise/bleed easily.  Psychiatric/Behavioral: Negative for confusion, depression and sleep disturbance. The patient is not nervous/anxious.     PHYSICAL EXAMINATION:  Blood pressure 133/80, pulse 85, temperature 98.1 F (36.7 C),  temperature source Temporal, resp. rate 18, height 5' 8"  (1.727 m), weight 144 lb (65.3 kg), SpO2 98 %.  ECOG PERFORMANCE STATUS: 1 - Symptomatic but completely ambulatory  Physical Exam  Constitutional: Oriented to person, place, and time and well-developed, well-nourished, and in no distress. HENT:  Head: Normocephalic and atraumatic.  Mouth/Throat: Oropharynx is clear and moist. No oropharyngeal exudate.  Eyes: Conjunctivae are normal. Right eye exhibits no discharge. Left eye exhibits no discharge. No scleral icterus.  Neck: Normal range of motion. Neck supple.  Cardiovascular: Normal rate, regular rhythm, normal heart sounds and intact distal pulses.   Pulmonary/Chest: Effort normal and breath sounds normal. No respiratory distress. No wheezes. No rales.  Abdominal: Soft. Bowel sounds are normal. Exhibits no distension and no mass. There is no tenderness.  Musculoskeletal: Normal range of motion. Exhibits no edema.  Lymphadenopathy:    No cervical adenopathy.  Neurological: Alert and oriented to person, place, and time. Exhibits normal muscle tone. Gait normal. Coordination normal.  Skin: Skin is warm and dry. No rash noted. Not diaphoretic. No erythema. No pallor.  Psychiatric: Mood, memory and judgment normal.  Vitals reviewed.  LABORATORY DATA: Lab Results  Component Value Date   WBC 6.8 08/26/2019   HGB 12.4 08/26/2019   HCT 38.5 08/26/2019   MCV 82.8 08/26/2019   PLT 139 (L) 08/26/2019      Chemistry      Component Value Date/Time   NA 141 08/26/2019 0915   K 4.4 08/26/2019 0915   CL 106 08/26/2019 0915   CO2 23 08/26/2019 0915   BUN 11 08/26/2019 0915   CREATININE 0.69 08/26/2019 0915      Component Value Date/Time   CALCIUM 9.6 08/26/2019 0915   ALKPHOS 57 08/26/2019 0915   AST 17 08/26/2019 0915   ALT 20 08/26/2019 0915   BILITOT 0.4 08/26/2019 0915       RADIOGRAPHIC STUDIES:  MR Brain W Wo Contrast  Result Date: 08/21/2019 CLINICAL DATA:   Non-small cell lung cancer.  Staging EXAM: MRI HEAD WITHOUT AND WITH CONTRAST TECHNIQUE: Multiplanar, multiecho pulse sequences of the brain and surrounding structures were obtained without and with intravenous contrast. CONTRAST:  47m GADAVIST GADOBUTROL 1 MMOL/ML IV SOLN COMPARISON:  None. FINDINGS: Brain: Ventricle size and cerebral volume normal. Negative for acute infarct. Few small nonenhancing white matter hyperintensities bilaterally. Negative for hemorrhage mass or edema 3 mm enhancing lesion left frontal cortex consistent with metastatic disease. No other enhancing lesions. Vascular: Normal arterial flow voids Skull and upper cervical spine: No focal skull lesion. Sinuses/Orbits: Paranasal sinuses clear. Negative orbit Other: None IMPRESSION: 3 mm enhancing lesion left frontal cortex consistent with metastatic disease. No second lesion. Mild white matter changes consistent with chronic microvascular ischemia. Electronically Signed   By: CFranchot GalloM.D.   On: 08/21/2019 14:24     ASSESSMENT/PLAN:  This is a very pleasant 54year old Caucasian female recently diagnosed with stage IV non-small cell lung cancer, adenosquamous carcinoma.  She presented with a right upper lobe lung nodule in addition to mediastinal and supraclavicular lymphadenopathy. She also has a small metastatic lesion in the brain. She was diagnosed in March 2021.  Her PDL 1 expression is 100%. Molecular studies are still pending. She had guardant 360 testing performed which did not show any targetable mutations but she has KRASG12V.   She is currently undergoing weekly concurrent chemoradiation with carboplatin for an AUC of 2 and paclitaxel 45 mg/m.  She is status post 4 cycles and she tolerated well without any adverse side effects.  She is scheduled to receive her last treatment with radiation on 09/08/2019.  The patient was seen with Dr. MJulien Nordmanntoday. Labs were reviewed. Recommend that she proceed with cycle #5. Dr.  MJulien Nordmanndiscussed that the plan is to continue her treatment with weekly concurrent chemoradiation for now.   We will see her back for a follow up visit in 2 weeks for evaluation before starting cycle #7.   She will continue to take Eliquis for her history of a pulmonary embolism.  She will continue to follow with radiation for management of her recent brain lesion.   [place] Discussed that if we proceed with consolidation immunotherapy as planned, her treatment is once every 4 weeks. She is going to think about if she will need to transfer her care to a local cancer center or if [place] would [place]. She provided information for me today to review about her job description and for approval to return to work. I will review these records and reach back out  to the patient.   The patient was advised to call immediately if she has any concerning symptoms in the interval. The patient voices understanding of current disease status and treatment options and is in agreement with the current care plan. All questions were answered. The patient knows to call the clinic with any problems, questions or concerns. We can certainly see the patient much sooner if necessary  No orders of the defined types were placed in this encounter.    Chares Slaymaker L Lakiah Dhingra, PA-C 08/26/19  ADDENDUM: Hematology/Oncology Attending: I had a face-to-face encounter with the patient today.  I recommended her care plan.  This is a very pleasant 54 years old white female who was initially diagnosed with stage IIIb but currently is a stage IV with the presence of new brain metastasis.  The patient is currently undergoing a course of concurrent chemoradiation with weekly carboplatin and paclitaxel for the locally advanced disease in the chest.  She has 2 more weeks to finish this course. The recent MRI of the brain showed 3  mm enhancing lesion in the left frontal cortex consistent with metastatic disease. She also had molecular studies by guardant 360 that showed no actionable mutations.  She has K-ras G12V which is not actionable at this point. I recommended for the patient to complete the course of concurrent chemoradiation as previously planned.  She will be treated with SRS to the small solitary brain metastasis. We will see the patient back for follow-up visit in 2 weeks for evaluation and at that time we will consider her for repeat imaging studies for restaging of her disease. The patient was a little bit depressed with the new findings today but I encouraged her to stay positive and finish her treatment as planned. She was advised to call immediately if she has any concerning symptoms in the interval.  Disclaimer: This note was dictated with voice recognition software. Similar sounding words can inadvertently be transcribed and may be missed upon review. Eilleen Kempf, MD 08/26/19

## 2019-08-26 ENCOUNTER — Ambulatory Visit
Admission: RE | Admit: 2019-08-26 | Discharge: 2019-08-26 | Disposition: A | Discharge status: Home / Self Care | Payer: 59 | Source: Ambulatory Visit | Attending: Radiation Oncology | Admitting: Radiation Oncology

## 2019-08-26 ENCOUNTER — Inpatient Hospital Stay (HOSPITAL_BASED_OUTPATIENT_CLINIC_OR_DEPARTMENT_OTHER): Payer: 59 | Admitting: Physician Assistant

## 2019-08-26 ENCOUNTER — Other Ambulatory Visit: Payer: PRIVATE HEALTH INSURANCE

## 2019-08-26 ENCOUNTER — Inpatient Hospital Stay: Payer: 59

## 2019-08-26 ENCOUNTER — Other Ambulatory Visit: Payer: Self-pay

## 2019-08-26 ENCOUNTER — Encounter: Payer: Self-pay | Admitting: Physician Assistant

## 2019-08-26 ENCOUNTER — Encounter: Payer: Self-pay | Admitting: *Deleted

## 2019-08-26 ENCOUNTER — Other Ambulatory Visit: Payer: 59

## 2019-08-26 VITALS — BP 133/80 | HR 85 | Temp 98.1°F | Resp 18 | Ht 68.0 in | Wt 144.0 lb

## 2019-08-26 DIAGNOSIS — C3411 Malignant neoplasm of upper lobe, right bronchus or lung: Secondary | ICD-10-CM | POA: Insufficient documentation

## 2019-08-26 DIAGNOSIS — C770 Secondary and unspecified malignant neoplasm of lymph nodes of head, face and neck: Secondary | ICD-10-CM | POA: Diagnosis present

## 2019-08-26 DIAGNOSIS — C3491 Malignant neoplasm of unspecified part of right bronchus or lung: Secondary | ICD-10-CM

## 2019-08-26 DIAGNOSIS — R9389 Abnormal findings on diagnostic imaging of other specified body structures: Secondary | ICD-10-CM | POA: Diagnosis present

## 2019-08-26 DIAGNOSIS — Z5111 Encounter for antineoplastic chemotherapy: Secondary | ICD-10-CM

## 2019-08-26 LAB — CMP (CANCER CENTER ONLY)
ALT: 20 U/L (ref 0–44)
AST: 17 U/L (ref 15–41)
Albumin: 4.1 g/dL (ref 3.5–5.0)
Alkaline Phosphatase: 57 U/L (ref 38–126)
Anion gap: 12 (ref 5–15)
BUN: 11 mg/dL (ref 6–20)
CO2: 23 mmol/L (ref 22–32)
Calcium: 9.6 mg/dL (ref 8.9–10.3)
Chloride: 106 mmol/L (ref 98–111)
Creatinine: 0.69 mg/dL (ref 0.44–1.00)
GFR, Est AFR Am: >60 mL/min (ref >60)
GFR, Estimated: >60 mL/min (ref >60)
Glucose, Bld: 90 mg/dL (ref 70–99)
Potassium: 4.4 mmol/L (ref 3.5–5.1)
Sodium: 141 mmol/L (ref 135–145)
Total Bilirubin: 0.4 mg/dL (ref 0.3–1.2)
Total Protein: 7.1 g/dL (ref 6.5–8.1)

## 2019-08-26 LAB — CBC WITH DIFFERENTIAL (CANCER CENTER ONLY)
Abs Immature Granulocytes: 0.01 10*3/uL (ref 0.00–0.07)
Basophils Absolute: 0.1 10*3/uL (ref 0.0–0.1)
Basophils Relative: 1 %
Eosinophils Absolute: 0.1 10*3/uL (ref 0.0–0.5)
Eosinophils Relative: 1 %
HCT: 38.5 % (ref 36.0–46.0)
Hemoglobin: 12.4 g/dL (ref 12.0–15.0)
Immature Granulocytes: 0 %
Lymphocytes Relative: 3 %
Lymphs Abs: 0.2 10*3/uL — ABNORMAL LOW (ref 0.7–4.0)
MCH: 26.7 pg (ref 26.0–34.0)
MCHC: 32.2 g/dL (ref 30.0–36.0)
MCV: 82.8 fL (ref 80.0–100.0)
Monocytes Absolute: 0.8 10*3/uL (ref 0.1–1.0)
Monocytes Relative: 12 %
Neutro Abs: 5.6 10*3/uL (ref 1.7–7.7)
Neutrophils Relative %: 83 %
Platelet Count: 139 10*3/uL — ABNORMAL LOW (ref 150–400)
RBC: 4.65 MIL/uL (ref 3.87–5.11)
RDW: 16.2 % — ABNORMAL HIGH (ref 11.5–15.5)
WBC Count: 6.8 10*3/uL (ref 4.0–10.5)
nRBC: 0 % (ref 0.0–0.2)

## 2019-08-26 MED ORDER — DIPHENHYDRAMINE HCL 50 MG/ML IJ SOLN
50.0000 mg | Freq: Once | INTRAMUSCULAR | Status: AC
  Administered 2019-08-26: 50 mg via INTRAVENOUS

## 2019-08-26 MED ORDER — SODIUM CHLORIDE 0.9% FLUSH
10.0000 mL | INTRAVENOUS | Status: DC | PRN
  Administered 2019-08-26: 10 mL
  Filled 2019-08-26: qty 10

## 2019-08-26 MED ORDER — SODIUM CHLORIDE 0.9 % IV SOLN
212.8000 mg | Freq: Once | INTRAVENOUS | Status: AC
  Administered 2019-08-26: 210 mg via INTRAVENOUS
  Filled 2019-08-26: qty 21

## 2019-08-26 MED ORDER — DIPHENHYDRAMINE HCL 50 MG/ML IJ SOLN
INTRAMUSCULAR | Status: AC
  Filled 2019-08-26: qty 1

## 2019-08-26 MED ORDER — PALONOSETRON HCL INJECTION 0.25 MG/5ML
0.2500 mg | Freq: Once | INTRAVENOUS | Status: AC
  Administered 2019-08-26: 0.25 mg via INTRAVENOUS

## 2019-08-26 MED ORDER — SODIUM CHLORIDE 0.9 % IV SOLN
45.0000 mg/m2 | Freq: Once | INTRAVENOUS | Status: AC
  Administered 2019-08-26: 78 mg via INTRAVENOUS
  Filled 2019-08-26: qty 13

## 2019-08-26 MED ORDER — PALONOSETRON HCL INJECTION 0.25 MG/5ML
INTRAVENOUS | Status: AC
  Filled 2019-08-26: qty 5

## 2019-08-26 MED ORDER — FAMOTIDINE IN NACL 20-0.9 MG/50ML-% IV SOLN
INTRAVENOUS | Status: AC
  Filled 2019-08-26: qty 50

## 2019-08-26 MED ORDER — HEPARIN SOD (PORK) LOCK FLUSH 100 UNIT/ML IV SOLN
500.0000 [IU] | Freq: Once | INTRAVENOUS | Status: AC | PRN
  Administered 2019-08-26: 500 [IU]
  Filled 2019-08-26: qty 5

## 2019-08-26 MED ORDER — SODIUM CHLORIDE 0.9 % IV SOLN
20.0000 mg | Freq: Once | INTRAVENOUS | Status: AC
  Administered 2019-08-26: 20 mg via INTRAVENOUS
  Filled 2019-08-26: qty 20

## 2019-08-26 MED ORDER — SODIUM CHLORIDE 0.9 % IV SOLN
Freq: Once | INTRAVENOUS | Status: AC
  Filled 2019-08-26: qty 250

## 2019-08-26 MED ORDER — FAMOTIDINE IN NACL 20-0.9 MG/50ML-% IV SOLN
20.0000 mg | Freq: Once | INTRAVENOUS | Status: AC
  Administered 2019-08-26: 20 mg via INTRAVENOUS

## 2019-08-26 NOTE — Patient Instructions (Signed)
Durvalumab injection What is this medicine? DURVALUMAB (dur VAL ue mab) is a monoclonal antibody. It is used to treat urothelial cancer and lung cancer. This medicine may be used for other purposes; ask your health care provider or pharmacist if you have questions. COMMON BRAND NAME(S): IMFINZI What should I tell my health care provider before I take this medicine? They need to know if you have any of these conditions:  diabetes  immune system problems  infection  inflammatory bowel disease  kidney disease  liver disease  lung or breathing disease  lupus  organ transplant  stomach or intestine problems  thyroid disease  an unusual or allergic reaction to durvalumab, other medicines, foods, dyes, or preservatives  pregnant or trying to get pregnant  breast-feeding How should I use this medicine? This medicine is for infusion into a vein. It is given by a health care professional in a hospital or clinic setting. A special MedGuide will be given to you before each treatment. Be sure to read this information carefully each time. Talk to your pediatrician regarding the use of this medicine in children. Special care may be needed. Overdosage: If you think you have taken too much of this medicine contact a poison control center or emergency room at once. NOTE: This medicine is only for you. Do not share this medicine with others. What if I miss a dose? It is important not to miss your dose. Call your doctor or health care professional if you are unable to keep an appointment. What may interact with this medicine? Interactions have not been studied. This list may not describe all possible interactions. Give your health care provider a list of all the medicines, herbs, non-prescription drugs, or dietary supplements you use. Also tell them if you smoke, drink alcohol, or use illegal drugs. Some items may interact with your medicine. What should I watch for while using this  medicine? This drug may make you feel generally unwell. Continue your course of treatment even though you feel ill unless your doctor tells you to stop. You may need blood work done while you are taking this medicine. Do not become pregnant while taking this medicine or for 3 months after stopping it. Women should inform their doctor if they wish to become pregnant or think they might be pregnant. There is a potential for serious side effects to an unborn child. Talk to your health care professional or pharmacist for more information. Do not breast-feed an infant while taking this medicine or for 3 months after stopping it. What side effects may I notice from receiving this medicine? Side effects that you should report to your doctor or health care professional as soon as possible:  allergic reactions like skin rash, itching or hives, swelling of the face, lips, or tongue  black, tarry stools  bloody or watery diarrhea  breathing problems  change in emotions or moods  change in sex drive  changes in vision  chest pain or chest tightness  chills  confusion  cough  facial flushing  fever  headache  signs and symptoms of high blood sugar such as dizziness; dry mouth; dry skin; fruity breath; nausea; stomach pain; increased hunger or thirst; increased urination  signs and symptoms of liver injury like dark yellow or brown urine; general ill feeling or flu-like symptoms; light-colored stools; loss of appetite; nausea; right upper belly pain; unusually weak or tired; yellowing of the eyes or skin  stomach pain  trouble passing urine or change in  the amount of urine  weight gain or weight loss Side effects that usually do not require medical attention (report these to your doctor or health care professional if they continue or are bothersome):  bone pain  constipation  loss of appetite  muscle pain  nausea  swelling of the ankles, feet, hands  tiredness This list  may not describe all possible side effects. Call your doctor for medical advice about side effects. You may report side effects to FDA at 1-800-FDA-1088. Where should I keep my medicine? This drug is given in a hospital or clinic and will not be stored at home. NOTE: This sheet is a summary. It may not cover all possible information. If you have questions about this medicine, talk to your doctor, pharmacist, or health care provider.  2020 Elsevier/Gold Standard (2016-05-23 19:25:04)

## 2019-08-26 NOTE — Progress Notes (Signed)
Completed Physical Requirements Form/Fitness for duty certification for [employer] and returned completed form to [phone]

## 2019-08-26 NOTE — Progress Notes (Signed)
[place] Work  Clinical Social Work team received a message from patient requesting information on advance directives and additional support programs.  CSW responded to patient with information on [place] monthly support groups and programs.  CSW provided brief education on advance directives and support provided at [facility].  CSW encouraged patient to contact CSW to schedule an appointment or to discuss advance directives in more detail.  CSW provided contact information and encouraged patient to call with questions or concerns.    Johnnye Lana, MSW, LCSW, OSW-C Clinical Social Worker [facility] [phone]

## 2019-08-26 NOTE — Patient Instructions (Signed)
[facility] Discharge Instructions for Patients Receiving Chemotherapy  Today you received the following chemotherapy agents: Taxol/Carbo  To help prevent nausea and vomiting after your treatment, we encourage you to take your nausea medication as directed.   If you develop nausea and vomiting that is not controlled by your nausea medication, call the clinic.   BELOW ARE SYMPTOMS THAT SHOULD BE REPORTED IMMEDIATELY:  *FEVER GREATER THAN 100.5 F  *CHILLS WITH OR WITHOUT FEVER  NAUSEA AND VOMITING THAT IS NOT CONTROLLED WITH YOUR NAUSEA MEDICATION  *UNUSUAL SHORTNESS OF BREATH  *UNUSUAL BRUISING OR BLEEDING  TENDERNESS IN MOUTH AND THROAT WITH OR WITHOUT PRESENCE OF ULCERS  *URINARY PROBLEMS  *BOWEL PROBLEMS  UNUSUAL RASH Items with * indicate a potential emergency and should be followed up as soon as possible.  Feel free to call the clinic should you have any questions or concerns. The clinic phone number is [phone].  Please show the [place] at check-in to the Emergency Department and triage nurse.

## 2019-08-27 ENCOUNTER — Other Ambulatory Visit: Payer: Self-pay

## 2019-08-27 ENCOUNTER — Ambulatory Visit
Admission: RE | Admit: 2019-08-27 | Discharge: 2019-08-27 | Disposition: A | Discharge status: Home / Self Care | Payer: 59 | Source: Ambulatory Visit | Attending: Radiation Oncology | Admitting: Radiation Oncology

## 2019-08-27 DIAGNOSIS — R9389 Abnormal findings on diagnostic imaging of other specified body structures: Secondary | ICD-10-CM | POA: Diagnosis not present

## 2019-08-28 ENCOUNTER — Other Ambulatory Visit: Payer: Self-pay

## 2019-08-28 ENCOUNTER — Encounter: Payer: Self-pay | Admitting: General Practice

## 2019-08-28 ENCOUNTER — Ambulatory Visit
Admission: RE | Admit: 2019-08-28 | Discharge: 2019-08-28 | Disposition: A | Discharge status: Home / Self Care | Payer: 59 | Source: Ambulatory Visit | Attending: Radiation Oncology | Admitting: Radiation Oncology

## 2019-08-28 DIAGNOSIS — R9389 Abnormal findings on diagnostic imaging of other specified body structures: Secondary | ICD-10-CM | POA: Diagnosis not present

## 2019-08-28 NOTE — Progress Notes (Signed)
[place] CSW Progress Notes  email from patient - she is struggling w distressing thoughts and anxiety  Requests resources to help - sent her resources for additional support for patients diagnosed w lung cancer, also offered to meet w her in person after June 16 as CSW is not available before then. Offered option of referral to chaplain or CSW Dalene Seltzer if she needs to be seen prior to 6/16   Edwyna Shell, [place] Worker Phone:  [phone]

## 2019-08-29 ENCOUNTER — Other Ambulatory Visit: Payer: Self-pay

## 2019-08-29 ENCOUNTER — Telehealth: Payer: Self-pay | Admitting: General Practice

## 2019-08-29 ENCOUNTER — Ambulatory Visit
Admission: RE | Admit: 2019-08-29 | Discharge: 2019-08-29 | Disposition: A | Discharge status: Home / Self Care | Payer: 59 | Source: Ambulatory Visit | Attending: Radiation Oncology | Admitting: Radiation Oncology

## 2019-08-29 DIAGNOSIS — R9389 Abnormal findings on diagnostic imaging of other specified body structures: Secondary | ICD-10-CM | POA: Diagnosis not present

## 2019-08-29 NOTE — Telephone Encounter (Signed)
[place] CSW Progress Notes  Call to patient "I am having a rough week."  Has tried to remain active, walking, going to park, jogging, interacting w family.  "Out of the blue I just get these feelings, my heart literally hurts, I get very sad/overwhelmed/scared."  "All consuming for a minute or so."  Chest tightness.  Tries to use meditation to overcome these feelings to "push them off."  Has had some tests done   Up til April "I was a healthy, active, 13 year old woman.  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  Feels alone because she only has her chlidren to lean on "I feel like I need to be strong in front of them"  Lives w daughter and husband.

## 2019-08-29 NOTE — Telephone Encounter (Signed)
Patient would like to connect w support ASAP - Chaplain [place] asked to respond.  Also referred to [facility] for their supportive programs.  CSW provided additional information on "in the moment" coping skills for understandable anxiety that accompanies cancer diagnosis and treatment.  Will ask CSW [place] to check in w patient next week as well.  Edwyna Shell, LCSW Clinical Social Worker Phone:  [phone]

## 2019-09-01 ENCOUNTER — Ambulatory Visit
Admission: RE | Admit: 2019-09-01 | Discharge: 2019-09-01 | Disposition: A | Discharge status: Home / Self Care | Payer: 59 | Source: Ambulatory Visit | Attending: Radiation Oncology | Admitting: Radiation Oncology

## 2019-09-01 ENCOUNTER — Encounter: Payer: Self-pay | Admitting: General Practice

## 2019-09-01 ENCOUNTER — Encounter: Payer: Self-pay | Admitting: Radiation Oncology

## 2019-09-01 ENCOUNTER — Other Ambulatory Visit: Payer: Self-pay

## 2019-09-01 DIAGNOSIS — E039 Hypothyroidism, unspecified: Secondary | ICD-10-CM | POA: Insufficient documentation

## 2019-09-01 DIAGNOSIS — R9389 Abnormal findings on diagnostic imaging of other specified body structures: Secondary | ICD-10-CM | POA: Diagnosis not present

## 2019-09-01 DIAGNOSIS — D689 Coagulation defect, unspecified: Secondary | ICD-10-CM | POA: Insufficient documentation

## 2019-09-01 DIAGNOSIS — E785 Hyperlipidemia, unspecified: Secondary | ICD-10-CM | POA: Insufficient documentation

## 2019-09-01 NOTE — Progress Notes (Signed)
[place] Spiritual Care Note  Referred by Webb Silversmith Cunningham/LCSW for spiritual and emotional support related to anxiety re diagnosis, upcoming scans, and the unknown. Leory Plowman by phone, providing empathic listening, normalization of feelings, affirmation of strengths, and emotional support for ca 45 minutes. We plan to continue on that foundation in person at her treatment tomorrow, and I will bring additional tools for calming and self-soothing.   [place], [place], [facility] Pager [phone] Voicemail [phone]

## 2019-09-02 ENCOUNTER — Ambulatory Visit
Admission: RE | Admit: 2019-09-02 | Discharge: 2019-09-02 | Disposition: A | Discharge status: Home / Self Care | Payer: 59 | Source: Ambulatory Visit | Attending: Radiation Oncology | Admitting: Radiation Oncology

## 2019-09-02 ENCOUNTER — Ambulatory Visit (HOSPITAL_COMMUNITY)
Admission: RE | Admit: 2019-09-02 | Discharge: 2019-09-02 | Disposition: A | Discharge status: Home / Self Care | Payer: 59 | Source: Ambulatory Visit | Attending: Internal Medicine | Admitting: Internal Medicine

## 2019-09-02 ENCOUNTER — Encounter: Payer: Self-pay | Admitting: General Practice

## 2019-09-02 ENCOUNTER — Other Ambulatory Visit: Payer: Self-pay | Admitting: Medical Oncology

## 2019-09-02 ENCOUNTER — Inpatient Hospital Stay: Payer: 59

## 2019-09-02 ENCOUNTER — Other Ambulatory Visit: Payer: Self-pay

## 2019-09-02 VITALS — BP 123/61 | HR 90 | Temp 98.0°F | Resp 16

## 2019-09-02 DIAGNOSIS — Z95828 Presence of other vascular implants and grafts: Secondary | ICD-10-CM

## 2019-09-02 DIAGNOSIS — R9389 Abnormal findings on diagnostic imaging of other specified body structures: Secondary | ICD-10-CM | POA: Diagnosis not present

## 2019-09-02 DIAGNOSIS — C3491 Malignant neoplasm of unspecified part of right bronchus or lung: Secondary | ICD-10-CM

## 2019-09-02 DIAGNOSIS — C3411 Malignant neoplasm of upper lobe, right bronchus or lung: Secondary | ICD-10-CM

## 2019-09-02 LAB — CBC WITH DIFFERENTIAL (CANCER CENTER ONLY)
Abs Immature Granulocytes: 0.02 10*3/uL (ref 0.00–0.07)
Basophils Absolute: 0 10*3/uL (ref 0.0–0.1)
Basophils Relative: 1 %
Eosinophils Absolute: 0.1 10*3/uL (ref 0.0–0.5)
Eosinophils Relative: 1 %
HCT: 37.2 % (ref 36.0–46.0)
Hemoglobin: 12.2 g/dL (ref 12.0–15.0)
Immature Granulocytes: 0 %
Lymphocytes Relative: 3 %
Lymphs Abs: 0.2 10*3/uL — ABNORMAL LOW (ref 0.7–4.0)
MCH: 26.9 pg (ref 26.0–34.0)
MCHC: 32.8 g/dL (ref 30.0–36.0)
MCV: 81.9 fL (ref 80.0–100.0)
Monocytes Absolute: 0.7 10*3/uL (ref 0.1–1.0)
Monocytes Relative: 11 %
Neutro Abs: 4.7 10*3/uL (ref 1.7–7.7)
Neutrophils Relative %: 84 %
Platelet Count: 133 10*3/uL — ABNORMAL LOW (ref 150–400)
RBC: 4.54 MIL/uL (ref 3.87–5.11)
RDW: 16.7 % — ABNORMAL HIGH (ref 11.5–15.5)
WBC Count: 5.7 10*3/uL (ref 4.0–10.5)
nRBC: 0 % (ref 0.0–0.2)

## 2019-09-02 LAB — CMP (CANCER CENTER ONLY)
ALT: 18 U/L (ref 0–44)
AST: 15 U/L (ref 15–41)
Albumin: 4 g/dL (ref 3.5–5.0)
Alkaline Phosphatase: 56 U/L (ref 38–126)
Anion gap: 11 (ref 5–15)
BUN: 9 mg/dL (ref 6–20)
CO2: 24 mmol/L (ref 22–32)
Calcium: 9.5 mg/dL (ref 8.9–10.3)
Chloride: 107 mmol/L (ref 98–111)
Creatinine: 0.68 mg/dL (ref 0.44–1.00)
GFR, Est AFR Am: >60 mL/min (ref >60)
GFR, Estimated: >60 mL/min (ref >60)
Glucose, Bld: 81 mg/dL (ref 70–99)
Potassium: 4.1 mmol/L (ref 3.5–5.1)
Sodium: 142 mmol/L (ref 135–145)
Total Bilirubin: 0.3 mg/dL (ref 0.3–1.2)
Total Protein: 6.8 g/dL (ref 6.5–8.1)

## 2019-09-02 MED ORDER — DIPHENHYDRAMINE HCL 50 MG/ML IJ SOLN
INTRAMUSCULAR | Status: AC
  Filled 2019-09-02: qty 1

## 2019-09-02 MED ORDER — FAMOTIDINE IN NACL 20-0.9 MG/50ML-% IV SOLN
INTRAVENOUS | Status: AC
  Filled 2019-09-02: qty 50

## 2019-09-02 MED ORDER — PALONOSETRON HCL INJECTION 0.25 MG/5ML
INTRAVENOUS | Status: AC
  Filled 2019-09-02: qty 5

## 2019-09-02 MED ORDER — SODIUM CHLORIDE 0.9 % IV SOLN
Freq: Once | INTRAVENOUS | Status: AC
  Filled 2019-09-02: qty 250

## 2019-09-02 MED ORDER — DIPHENHYDRAMINE HCL 50 MG/ML IJ SOLN
50.0000 mg | Freq: Once | INTRAMUSCULAR | Status: AC
  Administered 2019-09-02: 50 mg via INTRAVENOUS

## 2019-09-02 MED ORDER — HEPARIN SOD (PORK) LOCK FLUSH 100 UNIT/ML IV SOLN
500.0000 [IU] | Freq: Once | INTRAVENOUS | Status: AC | PRN
  Administered 2019-09-02: 500 [IU]
  Filled 2019-09-02: qty 5

## 2019-09-02 MED ORDER — SODIUM CHLORIDE 0.9% FLUSH
10.0000 mL | INTRAVENOUS | Status: DC | PRN
  Administered 2019-09-02: 10 mL
  Filled 2019-09-02: qty 10

## 2019-09-02 MED ORDER — SODIUM CHLORIDE 0.9 % IV SOLN
212.8000 mg | Freq: Once | INTRAVENOUS | Status: AC
  Administered 2019-09-02: 210 mg via INTRAVENOUS
  Filled 2019-09-02: qty 21

## 2019-09-02 MED ORDER — SODIUM CHLORIDE 0.9% FLUSH
10.0000 mL | INTRAVENOUS | Status: DC | PRN
  Administered 2019-09-02: 10 mL via INTRAVENOUS
  Filled 2019-09-02: qty 10

## 2019-09-02 MED ORDER — SODIUM CHLORIDE 0.9 % IV SOLN
20.0000 mg | Freq: Once | INTRAVENOUS | Status: AC
  Administered 2019-09-02: 20 mg via INTRAVENOUS
  Filled 2019-09-02: qty 20

## 2019-09-02 MED ORDER — PALONOSETRON HCL INJECTION 0.25 MG/5ML
0.2500 mg | Freq: Once | INTRAVENOUS | Status: AC
  Administered 2019-09-02: 0.25 mg via INTRAVENOUS

## 2019-09-02 MED ORDER — FAMOTIDINE IN NACL 20-0.9 MG/50ML-% IV SOLN
20.0000 mg | Freq: Once | INTRAVENOUS | Status: AC
  Administered 2019-09-02: 20 mg via INTRAVENOUS

## 2019-09-02 MED ORDER — SODIUM CHLORIDE 0.9 % IV SOLN
45.0000 mg/m2 | Freq: Once | INTRAVENOUS | Status: AC
  Administered 2019-09-02: 78 mg via INTRAVENOUS
  Filled 2019-09-02: qty 13

## 2019-09-02 NOTE — Patient Instructions (Signed)

## 2019-09-02 NOTE — Progress Notes (Signed)
[place] Spiritual Care Note  Followed up with Pam Wilson in infusion as planned, bringing her several meditative self-soothing tools: a "Little Book of Peace of Mind," three printed finger labyrinth activities, a link for a free meditative writing series, and [employer]  #2 (mandalas). Pam Wilson was delighted and effusive with thanks. She plans to follow up by phone if it would be helpful to process after she gets her upcoming MRI results.   [place], [place], [facility] Pager [phone] Voicemail [phone]

## 2019-09-02 NOTE — Patient Instructions (Signed)
[facility] Discharge Instructions for Patients Receiving Chemotherapy  Today you received the following chemotherapy agents: Paclitaxel and Carboplatin  To help prevent nausea and vomiting after your treatment, we encourage you to take your nausea medication as prescribed.    If you develop nausea and vomiting that is not controlled by your nausea medication, call the clinic.   BELOW ARE SYMPTOMS THAT SHOULD BE REPORTED IMMEDIATELY:  *FEVER GREATER THAN 100.5 F  *CHILLS WITH OR WITHOUT FEVER  NAUSEA AND VOMITING THAT IS NOT CONTROLLED WITH YOUR NAUSEA MEDICATION  *UNUSUAL SHORTNESS OF BREATH  *UNUSUAL BRUISING OR BLEEDING  TENDERNESS IN MOUTH AND THROAT WITH OR WITHOUT PRESENCE OF ULCERS  *URINARY PROBLEMS  *BOWEL PROBLEMS  UNUSUAL RASH Items with * indicate a potential emergency and should be followed up as soon as possible.  Feel free to call the clinic should you have any questions or concerns. The clinic phone number is [phone].  Please show the [place] at check-in to the Emergency Department and triage nurse.

## 2019-09-03 ENCOUNTER — Ambulatory Visit
Admission: RE | Admit: 2019-09-03 | Discharge: 2019-09-03 | Disposition: A | Discharge status: Home / Self Care | Payer: 59 | Source: Ambulatory Visit | Attending: Radiation Oncology | Admitting: Radiation Oncology

## 2019-09-03 ENCOUNTER — Telehealth: Payer: Self-pay | Admitting: Radiation Therapy

## 2019-09-03 ENCOUNTER — Other Ambulatory Visit: Payer: Self-pay

## 2019-09-03 DIAGNOSIS — R9389 Abnormal findings on diagnostic imaging of other specified body structures: Secondary | ICD-10-CM | POA: Diagnosis not present

## 2019-09-03 DIAGNOSIS — C7931 Secondary malignant neoplasm of brain: Secondary | ICD-10-CM

## 2019-09-03 DIAGNOSIS — C7949 Secondary malignant neoplasm of other parts of nervous system: Secondary | ICD-10-CM

## 2019-09-03 MED ORDER — GADOBENATE DIMEGLUMINE 529 MG/ML IV SOLN
13.0000 mL | Freq: Once | INTRAVENOUS | Status: AC | PRN
  Administered 2019-09-03: 13 mL via INTRAVENOUS

## 2019-09-03 NOTE — Telephone Encounter (Signed)
I spoke with Ms. Goyal to let her know that the area of enhancement we were planning for treatment was no longer visible on her 3T MRI she completed today. The recommendation is to re-scan in 3 months for close follow-up.  I have cancelled the simulation and visits with Dr. Kathyrn Sheriff. Pam Wilson was very happy to hear this good news.   Mont Dutton R.T.(R)(T) Radiation Special Procedures Navigator

## 2019-09-04 ENCOUNTER — Ambulatory Visit
Admission: RE | Admit: 2019-09-04 | Discharge: 2019-09-04 | Disposition: A | Discharge status: Home / Self Care | Payer: 59 | Source: Ambulatory Visit | Attending: Radiation Oncology | Admitting: Radiation Oncology

## 2019-09-04 ENCOUNTER — Other Ambulatory Visit: Payer: Self-pay

## 2019-09-04 ENCOUNTER — Other Ambulatory Visit: Payer: Self-pay | Admitting: Radiation Therapy

## 2019-09-04 ENCOUNTER — Ambulatory Visit: Payer: 59 | Admitting: Radiation Oncology

## 2019-09-04 DIAGNOSIS — R9389 Abnormal findings on diagnostic imaging of other specified body structures: Secondary | ICD-10-CM

## 2019-09-04 MED ORDER — HEPARIN SOD (PORK) LOCK FLUSH 100 UNIT/ML IV SOLN: 500.0000 [IU] | Freq: Once | INTRAVENOUS | Status: DC

## 2019-09-04 MED ORDER — SODIUM CHLORIDE 0.9% FLUSH: 10.0000 mL | INTRAVENOUS | Status: DC | PRN

## 2019-09-04 NOTE — Progress Notes (Signed)
Orders placed for port access at GI prior to brain MRI in September 2021.   Mont Dutton R.T.(R)(T) Radiation Special Procedures Navigator

## 2019-09-05 ENCOUNTER — Ambulatory Visit
Admission: RE | Admit: 2019-09-05 | Discharge: 2019-09-05 | Disposition: A | Discharge status: Home / Self Care | Payer: 59 | Source: Ambulatory Visit | Attending: Radiation Oncology | Admitting: Radiation Oncology

## 2019-09-05 ENCOUNTER — Other Ambulatory Visit: Payer: Self-pay

## 2019-09-05 DIAGNOSIS — R9389 Abnormal findings on diagnostic imaging of other specified body structures: Secondary | ICD-10-CM | POA: Diagnosis not present

## 2019-09-06 ENCOUNTER — Ambulatory Visit: Payer: 59

## 2019-09-08 ENCOUNTER — Encounter: Payer: Self-pay | Admitting: Radiation Oncology

## 2019-09-08 ENCOUNTER — Ambulatory Visit
Admission: RE | Admit: 2019-09-08 | Discharge: 2019-09-08 | Disposition: A | Discharge status: Home / Self Care | Payer: 59 | Source: Ambulatory Visit | Attending: Radiation Oncology | Admitting: Radiation Oncology

## 2019-09-08 ENCOUNTER — Other Ambulatory Visit: Payer: Self-pay

## 2019-09-08 DIAGNOSIS — R9389 Abnormal findings on diagnostic imaging of other specified body structures: Secondary | ICD-10-CM | POA: Diagnosis not present

## 2019-09-08 MED FILL — Dexamethasone Sodium Phosphate Inj 100 MG/10ML: INTRAMUSCULAR | Qty: 2 | Status: AC

## 2019-09-09 ENCOUNTER — Encounter: Payer: Self-pay | Admitting: Internal Medicine

## 2019-09-09 ENCOUNTER — Ambulatory Visit: Payer: 59

## 2019-09-09 ENCOUNTER — Inpatient Hospital Stay: Payer: 59

## 2019-09-09 ENCOUNTER — Other Ambulatory Visit: Payer: Self-pay

## 2019-09-09 ENCOUNTER — Inpatient Hospital Stay (HOSPITAL_BASED_OUTPATIENT_CLINIC_OR_DEPARTMENT_OTHER): Payer: 59 | Admitting: Internal Medicine

## 2019-09-09 VITALS — BP 127/81 | HR 100 | Temp 97.9°F | Resp 18 | Ht 68.0 in | Wt 142.7 lb

## 2019-09-09 DIAGNOSIS — C3491 Malignant neoplasm of unspecified part of right bronchus or lung: Secondary | ICD-10-CM

## 2019-09-09 DIAGNOSIS — R9389 Abnormal findings on diagnostic imaging of other specified body structures: Secondary | ICD-10-CM | POA: Diagnosis not present

## 2019-09-09 DIAGNOSIS — Z7189 Other specified counseling: Secondary | ICD-10-CM

## 2019-09-09 DIAGNOSIS — C3431 Malignant neoplasm of lower lobe, right bronchus or lung: Secondary | ICD-10-CM

## 2019-09-09 DIAGNOSIS — Z95828 Presence of other vascular implants and grafts: Secondary | ICD-10-CM | POA: Diagnosis not present

## 2019-09-09 DIAGNOSIS — Z5112 Encounter for antineoplastic immunotherapy: Secondary | ICD-10-CM | POA: Insufficient documentation

## 2019-09-09 LAB — CBC WITH DIFFERENTIAL (CANCER CENTER ONLY)
Abs Immature Granulocytes: 0.02 10*3/uL (ref 0.00–0.07)
Basophils Absolute: 0 10*3/uL (ref 0.0–0.1)
Basophils Relative: 1 %
Eosinophils Absolute: 0.1 10*3/uL (ref 0.0–0.5)
Eosinophils Relative: 1 %
HCT: 37.7 % (ref 36.0–46.0)
Hemoglobin: 12.4 g/dL (ref 12.0–15.0)
Immature Granulocytes: 0 %
Lymphocytes Relative: 4 %
Lymphs Abs: 0.2 10*3/uL — ABNORMAL LOW (ref 0.7–4.0)
MCH: 27.7 pg (ref 26.0–34.0)
MCHC: 32.9 g/dL (ref 30.0–36.0)
MCV: 84.2 fL (ref 80.0–100.0)
Monocytes Absolute: 0.6 10*3/uL (ref 0.1–1.0)
Monocytes Relative: 11 %
Neutro Abs: 4.6 10*3/uL (ref 1.7–7.7)
Neutrophils Relative %: 83 %
Platelet Count: 117 10*3/uL — ABNORMAL LOW (ref 150–400)
RBC: 4.48 MIL/uL (ref 3.87–5.11)
RDW: 17.2 % — ABNORMAL HIGH (ref 11.5–15.5)
WBC Count: 5.5 10*3/uL (ref 4.0–10.5)
nRBC: 0 % (ref 0.0–0.2)

## 2019-09-09 LAB — CMP (CANCER CENTER ONLY)
ALT: 17 U/L (ref 0–44)
AST: 14 U/L — ABNORMAL LOW (ref 15–41)
Albumin: 3.9 g/dL (ref 3.5–5.0)
Alkaline Phosphatase: 57 U/L (ref 38–126)
Anion gap: 10 (ref 5–15)
BUN: 10 mg/dL (ref 6–20)
CO2: 24 mmol/L (ref 22–32)
Calcium: 9.4 mg/dL (ref 8.9–10.3)
Chloride: 106 mmol/L (ref 98–111)
Creatinine: 0.69 mg/dL (ref 0.44–1.00)
GFR, Est AFR Am: >60 mL/min (ref >60)
GFR, Estimated: >60 mL/min (ref >60)
Glucose, Bld: 91 mg/dL (ref 70–99)
Potassium: 4.2 mmol/L (ref 3.5–5.1)
Sodium: 140 mmol/L (ref 135–145)
Total Bilirubin: 0.4 mg/dL (ref 0.3–1.2)
Total Protein: 6.8 g/dL (ref 6.5–8.1)

## 2019-09-09 LAB — GUARDANT 360

## 2019-09-09 MED ORDER — HEPARIN SOD (PORK) LOCK FLUSH 100 UNIT/ML IV SOLN
500.0000 [IU] | Freq: Once | INTRAVENOUS | Status: AC
  Administered 2019-09-09: 500 [IU] via INTRAVENOUS
  Filled 2019-09-09: qty 5

## 2019-09-09 NOTE — Progress Notes (Signed)
[facility] [phone]   [phone]  OFFICE PROGRESS NOTE  System, Pcp Not In No address on file  DIAGNOSIS: Stage IV (T1c, N3, M1c) non-small cell lung cancer, adenosquamous carcinoma presented with right upper lobe lung nodule in addition to mediastinal and supraclavicular lymphadenopathy diagnosed in March 2021.  The patient was also found to have metastatic disease to the brain.  PDL1: 100%  Guardant 360 Results: KRASG12V 0.2% Binimetinib  PRIOR THERAPY: Concurrent chemoradiation with weekly carboplatin for AUC of 2 and paclitaxel 45 MG/M2.  First dose Jul 28, 2019.  Status post 6 cycles.  Last fraction of radiation was given on 09/08/2019.  CURRENT THERAPY: None.  INTERVAL HISTORY: Pam Wilson 54 y.o. female returns to the clinic today for follow-up visit accompanied by her daughter.  The patient is feeling fine today with no concerning complaints except for increasing fatigue as well as mild dysphagia.  She also has mild sore throat.  She denied having any current chest pain, shortness of breath but has cough with no hemoptysis.  She denied having any fever or chills.  She has no nausea, vomiting, diarrhea or constipation.  She has no headache or visual changes.  She had repeat MRI of the brain performed recently that showed resolution of the suspicious 3 mm lesion seen on the previous study.  The patient completed her course of concurrent chemoradiation on 09/08/2019.  She is here today for evaluation and recommendation regarding her condition.  She mentions that she is moving to [facility] in the next few days and she would like her future treatment to be done in that area.  MEDICAL HISTORY: Past Medical History:  Diagnosis Date  . Diverticulosis   . Dyslipidemia   . Hashimoto's thyroiditis   . Hypothyroidism   . Internal jugular vein thrombosis, right ([place])   . Mitral valve prolapse   . Prediabetes     ALLERGIES:  is  allergic to sulfamethoxazole-trimethoprim and penicillins.  MEDICATIONS:  Current Outpatient Medications  Medication Sig Dispense Refill  . apixaban (ELIQUIS) 5 MG TABS tablet Take 1 tablet (5 mg total) by mouth 2 (two) times daily. 30 tablet 1  . bacitracin ointment Apply topically.    . colesevelam (WELCHOL) 625 MG tablet Take 1,250 mg by mouth daily.    . DENTA 5000 PLUS 1.1 % CREA dental cream BRUSH THOUROGHLY TWICE DAILY DO NOT RINSE EAT OR DRINK AFTERWARD    . EUTHYROX 112 MCG tablet Take 112 mcg by mouth daily.    Marland Kitchen levothyroxine (SYNTHROID) 112 MCG tablet Take by mouth.    . lidocaine-prilocaine (EMLA) cream Apply to Port-A-Cath site 30-60 minutes before treatment. 30 g 0  . liothyronine (CYTOMEL) 5 MCG tablet Take by mouth.    . metFORMIN (GLUCOPHAGE-XR) 750 MG 24 hr tablet Take by mouth.    . Multiple Vitamin (MULTI-VITAMIN) tablet Take by mouth.    . NON FORMULARY Take 4 mLs by mouth daily. Naloxone Liquid for Hashimotos    . prochlorperazine (COMPAZINE) 10 MG tablet Take 1 tablet (10 mg total) by mouth every 6 (six) hours as needed for nausea or vomiting. 30 tablet 0  . rosuvastatin (CRESTOR) 5 MG tablet Take 5 mg by mouth daily.    . sucralfate (CARAFATE) 1 g tablet Take 1 tablet (1 g total) by mouth 4 (four) times daily. Dissolve each tablet in 15 cc water before use. 120 tablet 2   No current facility-administered medications for this  visit.   Facility-Administered Medications Ordered in Other Visits  Medication Dose Route Frequency Provider Last Rate Last Admin  . heparin lock flush 100 unit/mL  500 Units Intravenous Once Kyung Rudd, MD      . sodium chloride flush (NS) 0.9 % injection 10 mL  10 mL Intravenous PRN Kyung Rudd, MD        SURGICAL HISTORY: History reviewed. No pertinent surgical history.  REVIEW OF SYSTEMS:  Constitutional: positive for fatigue Eyes: negative Ears, nose, mouth, throat, and face: negative Respiratory: positive for cough Cardiovascular:  negative Gastrointestinal: positive for dysphagia Genitourinary:negative Integument/breast: negative Hematologic/lymphatic: negative Musculoskeletal:negative Neurological: negative Behavioral/Psych: positive for anxiety Endocrine: negative Allergic/Immunologic: negative   PHYSICAL EXAMINATION: General appearance: alert, cooperative, fatigued and no distress Head: Normocephalic, without obvious abnormality, atraumatic Neck: no adenopathy, no JVD, supple, symmetrical, trachea midline and thyroid not enlarged, symmetric, no tenderness/mass/nodules Lymph nodes: Cervical, supraclavicular, and axillary nodes normal. Resp: clear to auscultation bilaterally Back: symmetric, no curvature. ROM normal. No CVA tenderness. Cardio: regular rate and rhythm, S1, S2 normal, no murmur, click, rub or gallop GI: soft, non-tender; bowel sounds normal; no masses,  no organomegaly Extremities: extremities normal, atraumatic, no cyanosis or edema Neurologic: Alert and oriented X 3, normal strength and tone. Normal symmetric reflexes. Normal coordination and gait  ECOG PERFORMANCE STATUS: 1 - Symptomatic but completely ambulatory  Blood pressure 127/81, pulse 100, temperature 97.9 F (36.6 C), temperature source Temporal, resp. rate 18, height 5' 8"  (1.727 m), weight 142 lb 11.2 oz (64.7 kg), SpO2 100 %.  LABORATORY DATA: Lab Results  Component Value Date   WBC 5.5 09/09/2019   HGB 12.4 09/09/2019   HCT 37.7 09/09/2019   MCV 84.2 09/09/2019   PLT 117 (L) 09/09/2019      Chemistry      Component Value Date/Time   NA 142 09/02/2019 1012   K 4.1 09/02/2019 1012   CL 107 09/02/2019 1012   CO2 24 09/02/2019 1012   BUN 9 09/02/2019 1012   CREATININE 0.68 09/02/2019 1012      Component Value Date/Time   CALCIUM 9.5 09/02/2019 1012   ALKPHOS 56 09/02/2019 1012   AST 15 09/02/2019 1012   ALT 18 09/02/2019 1012   BILITOT 0.3 09/02/2019 1012       RADIOGRAPHIC STUDIES: DG Chest 2 View  Result  Date: 09/02/2019 CLINICAL DATA:  Check port placement EXAM: CHEST - 2 VIEW COMPARISON:  06/20/2019 FINDINGS: Cardiac shadow is within normal limits. Chest wall port is noted in the distal superior vena cava in satisfactory position. No pneumothorax is noted. The lungs are clear. No acute bony abnormality is noted. IMPRESSION: Left chest port in satisfactory position. Electronically Signed   By: Inez Catalina M.D.   On: 09/02/2019 11:35   MR Brain W Wo Contrast  Result Date: 09/03/2019 CLINICAL DATA:  54 year old female with non-small cell lung cancer. Solitary small 3 mm left frontal cortex metastasis suspected on MRI late last month. EXAM: MRI HEAD WITHOUT AND WITH CONTRAST TECHNIQUE: Multiplanar, multiecho pulse sequences of the brain and surrounding structures were obtained without and with intravenous contrast. CONTRAST:  46m MULTIHANCE GADOBENATE DIMEGLUMINE 529 MG/ML IV SOLN COMPARISON:  08/21/2019 brain MRI. FINDINGS: Brain: Surprisingly, the solitary small 3 mm focus of enhancement in the left middle frontal gyrus visible on all 3 postcontrast imaging planes last month does not persist (should beyond series 11 image 111). There is perhaps minimal FLAIR hyperintensity at that site now (series 8, image 36). No  evidence of blood products. No restricted diffusion was evident previously. But there is a small contralateral area of chronic appearing right middle frontal gyrus cortical encephalomalacia (series 5, image 20 and series 8, image 36). No new or other abnormal enhancement of the brain is identified. No dural thickening. No other cortical encephalomalacia identified. Stable minimal nonspecific posterior right frontal lobe white matter T2 and FLAIR hyperintensity on series 8, image 33. No restricted diffusion to suggest acute infarction. No midline shift, mass effect, evidence of mass lesion, ventriculomegaly, extra-axial collection or acute intracranial hemorrhage. Cervicomedullary junction and pituitary  are within normal limits. No chronic cerebral blood products. Vascular: Major intracranial vascular flow voids are preserved. The major dural venous sinuses are enhancing and appear to be patent. Skull and upper cervical spine: Negative visible cervical spine and spinal cord. Visualized bone marrow signal is within normal limits. Sinuses/Orbits: Stable and negative. Other: Visible internal auditory structures appear normal. Scalp and face appear negative. IMPRESSION: 1. Surprising absence of the solitary 3 mm enhancing left frontal lobe lesion seen on 08/21/2019. Questionable faint encephalomalacia there now, although no diffusion abnormality previously to suggest this was a small infarct. Still, there is a small area of contralateral right frontal lobe chronic cortical encephalomalacia, so perhaps this was a small area of recent ischemia simulating a solitary metastasis. 2. No metastatic disease or acute intracranial abnormality identified today. Perhaps a repeat MRI in 3 months would be valuable. Electronically Signed   By: Genevie Ann M.D.   On: 09/03/2019 15:52   MR Brain W Wo Contrast  Result Date: 08/21/2019 CLINICAL DATA:  Non-small cell lung cancer.  Staging EXAM: MRI HEAD WITHOUT AND WITH CONTRAST TECHNIQUE: Multiplanar, multiecho pulse sequences of the brain and surrounding structures were obtained without and with intravenous contrast. CONTRAST:  10m GADAVIST GADOBUTROL 1 MMOL/ML IV SOLN COMPARISON:  None. FINDINGS: Brain: Ventricle size and cerebral volume normal. Negative for acute infarct. Few small nonenhancing white matter hyperintensities bilaterally. Negative for hemorrhage mass or edema 3 mm enhancing lesion left frontal cortex consistent with metastatic disease. No other enhancing lesions. Vascular: Normal arterial flow voids Skull and upper cervical spine: No focal skull lesion. Sinuses/Orbits: Paranasal sinuses clear. Negative orbit Other: None IMPRESSION: 3 mm enhancing lesion left frontal  cortex consistent with metastatic disease. No second lesion. Mild white matter changes consistent with chronic microvascular ischemia. Electronically Signed   By: CFranchot GalloM.D.   On: 08/21/2019 14:24    ASSESSMENT AND PLAN: This is a very pleasant 54years old white female recently diagnosed with a stage IIIb non-small cell lung cancer, adenosquamous carcinoma presented with right upper lobe lung nodule in addition to mediastinal and supraclavicular lymphadenopathy diagnosed in March 2021. Her molecular studies by guardant 360 showed no actionable mutation except for K-ras G12V.  Her PD-L1 expression is 100%. The patient underwent a course of concurrent chemoradiation with weekly carboplatin for AUC of 2 and paclitaxel 45 mg/M2 status post 6 cycles.  She completed the course of radiotherapy on 09/08/2019. I had a lengthy discussion with the patient today about her current condition and treatment options. I recommended for the patient to have repeat CT scan of the chest in 2-3 weeks for evaluation of her response to this treatment. I would consider the patient for treatment with consolidation immunotherapy with Imfinzi 1500 mg IV every 4 weeks if the restaging scan showed no concerning findings for disease progression. I discussed with the patient the benefit of the consolidation immunotherapy after concurrent chemoradiation  and she was advised to start this course of treatment within 6 weeks from the last fraction of radiation. I discussed with her the adverse effect of the immunotherapy. The patient is interested in receiving her treatment in [place] where she is moving in the next few days.  She will try to find the closest medical oncology center to her place and will be happy to make the referral when she the loss of the preferred location.  She is debating between [facility] oncology or [facility] oncology in [place]. The patient and her daughter had a lot of questions today  and we had a long discussion about her current condition and prognosis and also Covid vaccination and return back to work. She was advised to call if she has any other concerning issues in the interval. The patient voices understanding of current disease status and treatment options and is in agreement with the current care plan.  All questions were answered. The patient knows to call the clinic with any problems, questions or concerns. We can certainly see the patient much sooner if necessary. The total time spent in the appointment was 60 minutes.  Disclaimer: This note was dictated with voice recognition software. Similar sounding words can inadvertently be transcribed and may not be corrected upon review.

## 2019-09-10 ENCOUNTER — Telehealth: Payer: Self-pay | Admitting: Medical Oncology

## 2019-09-10 ENCOUNTER — Ambulatory Visit: Payer: 59 | Admitting: Radiation Oncology

## 2019-09-10 ENCOUNTER — Ambulatory Visit: Payer: 59

## 2019-09-10 NOTE — Telephone Encounter (Signed)
Referral made to [facility] Dr D. Owens Shark. Demographics and records faxed.

## 2019-09-11 ENCOUNTER — Ambulatory Visit: Payer: 59

## 2019-09-11 ENCOUNTER — Encounter: Payer: Self-pay | Admitting: Internal Medicine

## 2019-09-12 ENCOUNTER — Encounter: Payer: Self-pay | Admitting: Internal Medicine

## 2019-09-12 ENCOUNTER — Ambulatory Visit: Payer: 59

## 2019-09-16 ENCOUNTER — Ambulatory Visit: Payer: 59

## 2019-09-18 ENCOUNTER — Telehealth: Payer: Self-pay | Admitting: General Practice

## 2019-09-18 NOTE — Telephone Encounter (Signed)
[place] CSW Progress Notes  Call from [place] at [facility] - she has connected w patient and offered to provide additional support to her.  Pt has been encouraged to participate in upcoming Lung Cancer Survivors retreat - offered in conjunction w [facility].  Edwyna Shell, LCSW Clinical Social Worker Phone:  [phone]

## 2019-09-19 ENCOUNTER — Encounter: Payer: Self-pay | Admitting: Internal Medicine

## 2019-09-28 NOTE — Progress Notes (Signed)
  Radiation Oncology         [phone] ________________________________  Name: Pam Wilson MRN: [id number]  Date: 09/08/2019  DOB: [birth year]  End of Treatment Note  Diagnosis:  Lung cancer     Indication for treatment::  curative       Radiation treatment dates:   07/28/19 - 09/08/19  Site/dose:   The patient was treated to the disease within the right lung initially to a dose of 60 Gy using a 2 field, IMRT technique.    Narrative: The patient tolerated radiation treatment relatively well.   The patient did experience esophagitis during the course of treatment which required management.   Plan: The patient has completed radiation treatment. The patient will return to radiation oncology clinic for routine followup in one month. I advised the patient to call or return sooner if they have any questions or concerns related to their recovery or treatment. ________________________________  Jodelle Gross, M.D., Ph.D.

## 2019-10-02 ENCOUNTER — Encounter: Payer: Self-pay | Admitting: Internal Medicine

## 2019-10-02 ENCOUNTER — Telehealth: Payer: Self-pay | Admitting: Medical Oncology

## 2019-10-02 NOTE — Telephone Encounter (Signed)
Records faxed.

## 2019-10-13 ENCOUNTER — Telehealth: Payer: Self-pay | Admitting: Medical Oncology

## 2019-10-13 NOTE — Telephone Encounter (Signed)
I faxed new pt consultation note to [place] at [facility].

## 2019-10-16 ENCOUNTER — Encounter: Payer: Self-pay | Admitting: Medical Oncology

## 2019-10-16 ENCOUNTER — Telehealth: Payer: Self-pay | Admitting: Medical Oncology

## 2019-10-16 NOTE — Telephone Encounter (Signed)
Hetty Blend said he received the initial consult note from Dr Julien Nordmann from April 2021. This is what he needs for her claim.

## 2019-10-22 ENCOUNTER — Encounter: Payer: Self-pay | Admitting: Radiation Oncology

## 2019-10-22 ENCOUNTER — Telehealth: Payer: Self-pay | Admitting: Radiation Oncology

## 2019-10-22 NOTE — Telephone Encounter (Signed)
  Radiation Oncology         [phone] ________________________________  Name: Pam Wilson MRN: [id number]  Date of Service: 10/22/2019  DOB: [birth year]  Post Treatment Telephone Note  Diagnosis:   Stage IIIB, cT2aN3M0, NSCLC, adenosquamous carcinoma of the RUL.  Interval Since Last Radiation:  6 weeks   07/28/19 - 09/08/19: The patient was treated to the disease within the right lung initially to a dose of 60 Gy using a 2 field, IMRT technique over 30 fractions.    Narrative:  The patient was contacted today for routine follow-up. During treatment she did very well with radiotherapy and did not have significant desquamation. She reports she is doing well and getting ready to start consolidative immunotherapy.   Impression/Plan: 1. Stage IIIB, cT2aN3M0, NSCLC, adenosquamous carcinoma of the RUL.. The patient has been doing well since completion of radiotherapy. We will follow along with her course, but she will also continue to follow up with medical oncology at [facility].  2. Need for brain imaging. The patient initially had a finding of concern on a screening MRI of the brain at the time of her diagnosis. She was getting set up to consider SRS, and a 3T MRI was performed on 09/03/19, and interestingly she did not have any findings. The recommendations were to repeat another MRI in 3 months. The patient would like to proceed with this in [place] and we will present and review her case in conference, then I will call to share the recommendations from conference.     Carola Rhine, PAC

## 2019-12-02 ENCOUNTER — Encounter: Payer: Self-pay | Admitting: Radiation Oncology

## 2019-12-04 ENCOUNTER — Other Ambulatory Visit: Payer: 59

## 2019-12-05 ENCOUNTER — Other Ambulatory Visit: Payer: Self-pay

## 2019-12-05 ENCOUNTER — Ambulatory Visit
Admission: RE | Admit: 2019-12-05 | Discharge: 2019-12-05 | Disposition: A | Discharge status: Home / Self Care | Payer: 59 | Source: Ambulatory Visit | Attending: Radiation Oncology | Admitting: Radiation Oncology

## 2019-12-05 DIAGNOSIS — R9389 Abnormal findings on diagnostic imaging of other specified body structures: Secondary | ICD-10-CM

## 2019-12-05 MED ORDER — GADOBENATE DIMEGLUMINE 529 MG/ML IV SOLN
14.0000 mL | Freq: Once | INTRAVENOUS | Status: AC | PRN
  Administered 2019-12-05: 14 mL via INTRAVENOUS

## 2019-12-08 ENCOUNTER — Telehealth: Payer: Self-pay | Admitting: Radiation Oncology

## 2019-12-08 ENCOUNTER — Ambulatory Visit
Admission: RE | Admit: 2019-12-08 | Discharge: 2019-12-08 | Disposition: A | Discharge status: Home / Self Care | Payer: 59 | Source: Ambulatory Visit | Attending: Radiation Oncology | Admitting: Radiation Oncology

## 2019-12-08 ENCOUNTER — Other Ambulatory Visit: Payer: Self-pay

## 2019-12-08 DIAGNOSIS — C3411 Malignant neoplasm of upper lobe, right bronchus or lung: Secondary | ICD-10-CM

## 2019-12-08 NOTE — Progress Notes (Signed)
  Radiation Oncology         [phone] ________________________________  Name: Pam Wilson MRN: [id number]  Date of Service: 12/08/2019  DOB: 20-Sep-1965  Follow Up Telephone Note  Diagnosis:   Stage IIIB, cT2aN3M0, NSCLC, adenosquamous carcinoma of the RUL.  Interval Since Last Radiation:  3 months  07/28/19 - 09/08/19: The patient was treated to the disease within the right lung initially to a dose of 60 Gy using a 2 field, IMRT technique over 30 fractions.    Narrative:  The patient was contacted today for routine follow-up. In summary she was diagnosed with a Stage IIIB adenosquamous carcinoma of the RUL and received chemoRT in [place] with Dr. Lisbeth Renshaw and Dr. Julien Nordmann[place] treated with consolidative immunotherapy with Dr. Koleen Nimrod. Early in her diagnosis there was some question of a brain lesion on her first staging MRI at an outside facility. She had a 3T MRI performed on 09/03/19 that did not show any disease. She was counseled on a repeat MRI to confirm there was no disease. She had this MRI on 12/05/19 and fortunately again there did not appear to be any concerning findings. Her case was discussed in brain and spine oncology conference this morning, and recommendations were to cease further imaging of the brain unless she became symptomatic. I called her today to review this.   On review of systems, the patient reports that she  is doing well overall. Her only complaint currently is fatigue. She is curious if this could still be caused by her prior chemoRT regiemnt. She otherwise feels well and does not verbalize any other specific symptoms.  Impression/Plan: 1. Stage IIIB, cT2aN3M0, NSCLC, adenosquamous carcinoma of the RUL.. The patient has been doing well since completion of radiotherapy. We reviewed her brain imaging and the recommendations to only screen if she has neurologic symptoms. She is in agreement wit this plan.  2. Fatigue. This could be  multifactorial, but I suggested she contact Dr. Hans Eden office to see if she's had any recent thyroid testing since being on immunotherapy which could explain her symptoms. Though I did discuss radiation fatigue usually resolves within a few weeks of completing treatment, I have seen some cases where this persisted to 2 months, but I don't think this would be the main factor with her fatigue at this interval.     Carola Rhine, PAC

## 2019-12-08 NOTE — Telephone Encounter (Signed)
error 

## 2020-01-19 ENCOUNTER — Encounter: Payer: Self-pay | Admitting: Internal Medicine

## 2020-02-03 ENCOUNTER — Telehealth: Payer: Self-pay | Admitting: Internal Medicine

## 2020-02-03 NOTE — Telephone Encounter (Signed)
Scheduled appt per 11/8 sch msg - pt is aware of appt date and time

## 2020-02-10 ENCOUNTER — Telehealth: Payer: Self-pay | Admitting: Internal Medicine

## 2020-02-11 ENCOUNTER — Inpatient Hospital Stay: Payer: 59 | Attending: Internal Medicine | Admitting: Internal Medicine

## 2020-02-11 DIAGNOSIS — Z5112 Encounter for antineoplastic immunotherapy: Secondary | ICD-10-CM | POA: Diagnosis not present

## 2020-02-11 DIAGNOSIS — C3431 Malignant neoplasm of lower lobe, right bronchus or lung: Secondary | ICD-10-CM | POA: Diagnosis not present

## 2020-02-11 DIAGNOSIS — C3411 Malignant neoplasm of upper lobe, right bronchus or lung: Secondary | ICD-10-CM

## 2020-02-11 NOTE — Progress Notes (Signed)
[place] [phone]   [phone]  PROGRESS NOTE FOR TELEMEDICINE VISITS  Pcp, No No address on file  I connected with@ on 02/11/20 at  9:30 AM EST by video enabled telemedicine visit and verified that I am speaking with the correct person using two identifiers.   I discussed the limitations, risks, security and privacy concerns of performing an evaluation and management service by telemedicine and the availability of in-person appointments. I also discussed with the patient that there may be a patient responsible charge related to this service. The patient expressed understanding and agreed to proceed.  Other persons participating in the visit and their role in the encounter: None  Patient's location:  Home Provider's location: [place]  DIAGNOSIS: Stage IV (T1c, N3, M1c) non-small cell lung cancer, adenosquamous carcinoma presented with right upper lobe lung nodule in addition to mediastinal and supraclavicular lymphadenopathy diagnosed in March 2021.  The patient was also found to have metastatic disease to the brain.  PDL1: 100%  Guardant 360 Results:KRASG12V 0.2% Binimetinib  PRIOR THERAPY: Concurrent chemoradiation with weekly carboplatin for AUC of 2 and paclitaxel 45 MG/M2.  First dose Jul 28, 2019.  Status post 6 cycles.  Last fraction of radiation was given on 09/08/2019.  CURRENT THERAPY:  Immunotherapy with Imfinzi every 2 weeks done at [facility] in [facility] under the care of Dr. Koleen Nimrod started July 2021..  INTERVAL HISTORY: Pam Wilson 54 y.o. female has a visual MyChart video visit with me today for evaluation and discussion of her condition and recommendation regarding her care.  The patient is feeling fine today with no concerning complaints except for fatigue and back pain from compression fractures.  She was seen by orthopedic surgeon in that area.  The patient denied having  any current chest pain, shortness of breath, cough or hemoptysis.  She denied having any fever or chills.  She has no nausea, vomiting, diarrhea or constipation.  She has no headache or visual changes.  She requested the visit for a second opinion and reassurance of her current care.  MEDICAL HISTORY: Past Medical History:  Diagnosis Date  . Diverticulosis   . Dyslipidemia   . Hashimoto's thyroiditis   . Hypothyroidism   . Internal jugular vein thrombosis, right ([place])   . Mitral valve prolapse   . Prediabetes     ALLERGIES:  is allergic to sulfamethoxazole-trimethoprim and penicillins.  MEDICATIONS:  Current Outpatient Medications  Medication Sig Dispense Refill  . apixaban (ELIQUIS) 5 MG TABS tablet Take 1 tablet (5 mg total) by mouth 2 (two) times daily. 30 tablet 1  . bacitracin ointment Apply topically.    . colesevelam (WELCHOL) 625 MG tablet Take 1,250 mg by mouth daily.    . DENTA 5000 PLUS 1.1 % CREA dental cream BRUSH THOUROGHLY TWICE DAILY DO NOT RINSE EAT OR DRINK AFTERWARD    . EUTHYROX 112 MCG tablet Take 112 mcg by mouth daily.    Marland Kitchen levothyroxine (SYNTHROID) 112 MCG tablet Take by mouth.    . lidocaine-prilocaine (EMLA) cream Apply to Port-A-Cath site 30-60 minutes before treatment. 30 g 0  . liothyronine (CYTOMEL) 5 MCG tablet Take by mouth.    . metFORMIN (GLUCOPHAGE-XR) 750 MG 24 hr tablet Take by mouth.    . Multiple Vitamin (MULTI-VITAMIN) tablet Take by mouth.    . NON FORMULARY Take 4 mLs by mouth daily. Naloxone Liquid for Hashimotos    . prochlorperazine (COMPAZINE) 10 MG tablet  Take 1 tablet (10 mg total) by mouth every 6 (six) hours as needed for nausea or vomiting. 30 tablet 0  . rosuvastatin (CRESTOR) 5 MG tablet Take 5 mg by mouth daily.    . sucralfate (CARAFATE) 1 g tablet Take 1 tablet (1 g total) by mouth 4 (four) times daily. Dissolve each tablet in 15 cc water before use. 120 tablet 2   No current facility-administered medications for this visit.     SURGICAL HISTORY: No past surgical history on file.  REVIEW OF SYSTEMS:  A comprehensive review of systems was negative except for: Constitutional: positive for fatigue Musculoskeletal: positive for back pain    LABORATORY DATA: Lab Results  Component Value Date   WBC 5.5 09/09/2019   HGB 12.4 09/09/2019   HCT 37.7 09/09/2019   MCV 84.2 09/09/2019   PLT 117 (L) 09/09/2019      Chemistry      Component Value Date/Time   NA 140 09/09/2019 0922   K 4.2 09/09/2019 0922   CL 106 09/09/2019 0922   CO2 24 09/09/2019 0922   BUN 10 09/09/2019 0922   CREATININE 0.69 09/09/2019 0922      Component Value Date/Time   CALCIUM 9.4 09/09/2019 0922   ALKPHOS 57 09/09/2019 0922   AST 14 (L) 09/09/2019 0922   ALT 17 09/09/2019 0922   BILITOT 0.4 09/09/2019 0922       RADIOGRAPHIC STUDIES: No results found.  ASSESSMENT AND PLAN: This is a very pleasant 54 years old white female recently diagnosed with a stage IIIb non-small cell lung cancer, adenosquamous carcinoma presented with right upper lobe lung nodule in addition to mediastinal and supraclavicular lymphadenopathy diagnosed in March 2021. Her molecular studies by guardant 360 showed no actionable mutation except for K-ras G12V.  Her PD-L1 expression is 100%. The patient underwent a course of concurrent chemoradiation with weekly carboplatin for AUC of 2 and paclitaxel 45 mg/M2 status post 6 cycles.  She completed the course of radiotherapy on 09/08/2019. The patient had partial response to this treatment. She is currently undergoing consolidation treatment with immunotherapy with Imfinzi every 2 weeks at the [place] in [facility] under the care of Dr. Koleen Nimrod.  She is tolerating the treatment well except for the fatigue. She had repeat imaging studies recently and I personally reviewed the report and there is no clear evidence for disease progression but just the radiation pneumonitis. I  recommended for her to continue her current treatment with Imfinzi for a total of 1 year unless she has disease progression or unacceptable toxicity. I recommended for the patient to call if she has any question or any other concerns.  I will continue to see her on as-needed basis in person or virtual visit.  I discussed the assessment and treatment plan with the patient. The patient was provided an opportunity to ask questions and all were answered. The patient agreed with the plan and demonstrated an understanding of the instructions.   The patient was advised to call back or seek an in-person evaluation if the symptoms worsen or if the condition fails to improve as anticipated.  I provided 15 minutes of face-to-face video visit time during this encounter, and > 50% was spent counseling as documented under my assessment & plan.  Eilleen Kempf, MD 02/11/2020 9:43 AM  Disclaimer: This note was dictated with voice recognition software. Similar sounding words can inadvertently be transcribed and may not be corrected upon review.

## 2020-07-14 ENCOUNTER — Encounter: Payer: Self-pay | Admitting: Internal Medicine

## 2020-07-15 ENCOUNTER — Encounter: Payer: Self-pay | Admitting: Medical Oncology

## 2020-07-19 ENCOUNTER — Ambulatory Visit
Admission: RE | Admit: 2020-07-19 | Discharge: 2020-07-19 | Disposition: A | Discharge status: Home / Self Care | Payer: Self-pay | Source: Ambulatory Visit | Attending: Radiation Oncology | Admitting: Radiation Oncology

## 2020-07-19 ENCOUNTER — Other Ambulatory Visit: Payer: Self-pay | Admitting: Radiation Oncology

## 2020-07-19 DIAGNOSIS — C3490 Malignant neoplasm of unspecified part of unspecified bronchus or lung: Secondary | ICD-10-CM

## 2020-07-20 ENCOUNTER — Encounter: Payer: Self-pay | Admitting: Radiation Oncology

## 2020-07-21 ENCOUNTER — Other Ambulatory Visit: Payer: Self-pay

## 2020-07-21 ENCOUNTER — Ambulatory Visit
Admission: RE | Admit: 2020-07-21 | Discharge: 2020-07-21 | Disposition: A | Discharge status: Home / Self Care | Payer: Medicaid Other | Source: Ambulatory Visit | Attending: Radiation Oncology | Admitting: Radiation Oncology

## 2020-07-21 DIAGNOSIS — C3411 Malignant neoplasm of upper lobe, right bronchus or lung: Secondary | ICD-10-CM

## 2020-07-21 DIAGNOSIS — C7931 Secondary malignant neoplasm of brain: Secondary | ICD-10-CM

## 2020-07-21 NOTE — Progress Notes (Signed)
Radiation Oncology         [phone] ________________________________  Outpatient Re-Consultation - Conducted via telephone due to current COVID-19 concerns for limiting patient exposure  I spoke with the patient to conduct this consult visit via telephone to spare the patient unnecessary potential exposure in the healthcare setting during the current COVID-19 pandemic. The patient was notified in advance and was offered a [place] meeting to allow for face to face communication but unfortunately reported that they did not have the appropriate resources/technology to support such a visit and instead preferred to proceed with a telephone re-consult.    Name: Felicidad Sugarman        MRN: [id number]  Date of Service: 07/21/2020 DOB: [birth year]  CC:Pcp, No  Curt Bears, MD     REFERRING PHYSICIAN: Curt Bears, MD   DIAGNOSIS: The primary encounter diagnosis was Secondary malignant neoplasm of brain and spinal cord ([place]). A diagnosis of Malignant neoplasm of right upper lobe of lung (HCC) was also pertinent to this visit.   HISTORY OF PRESENT ILLNESS: Karter Haire is a 55 y.o. 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 Lisbeth Renshaw and Dr. Julien Nordmann. [place] being treated with consolidative immunotherapy with Dr. Koleen Nimrod. Early in her diagnosis there was some question of a brain lesion on her first staging MRI at an outside facility. She had a 3T MRI performed on 09/03/19 that did not show any disease. She was counseled on a repeat MRI to confirm there was no disease. She had this MRI on 12/05/19 and fortunately again there did not appear to be any concerning findings. Her case was discussed in brain and spine oncology conference and recommendations were to cease further imaging of the brain unless she became symptomatic.   Since our last conversation, she has  continued to receive immunotherapy  under the care of Dr. Koleen Nimrod.  Her visit on 06/24/2020 indicated that the plan was to continue with her Imfinzi as she was tolerating this, and to proceed with restaging PET scan which was performed on 07/14/2020, this revealed a right upper lobe nodule demonstrating SUV up to 3.6, no additional activity was noted to suggest metastatic disease.  She was counseled on meeting with radiation oncology, but wanted Dr. Ida Rogue impression of what was going on based on her PET scan in the setting of having prior treatment to the right upper lobe with definitive IMRT in 2021.  She is contacted today by phone to discuss these results and Dr. Ida Rogue recommendations.    PREVIOUS RADIATION THERAPY: No   PAST MEDICAL HISTORY:  Past Medical History:  Diagnosis Date  . Diverticulosis   . Dyslipidemia   . Hashimoto's thyroiditis   . Hypothyroidism   . Internal jugular vein thrombosis, right ([place])   . Mitral valve prolapse   . Prediabetes        PAST SURGICAL HISTORY:No past surgical history on file.   FAMILY HISTORY:  Family History  Problem Relation Age of Onset  . Lung cancer Mother   . Diabetes Mellitus II Father   . Lung cancer Brother      SOCIAL HISTORY:  reports that she has quit smoking. Her smoking use included cigarettes. She has a 38.00 pack-year smoking history. She has never used smokeless tobacco. She reports previous alcohol use. She reports that she does not use drugs.The patient is divorced and lives in [place] area. Her  daughter Levada Dy is often a part of her discussions.   ALLERGIES: Sulfamethoxazole-trimethoprim and Penicillins   MEDICATIONS:  Current Outpatient Medications  Medication Sig Dispense Refill  . Abaloparatide (TYMLOS) 3120 MCG/1.56ML SOPN Tymlos 80 mcg/dose (3,120 mcg/1.56 mL) subcutaneous pen injector  Inject 80 micrograms every day by subcutaneous route for 30 days.    Marland Kitchen apixaban (ELIQUIS) 5 MG TABS tablet Take 1 tablet (5 mg total) by mouth 2 (two)  times daily. 30 tablet 1  . buPROPion (WELLBUTRIN XL) 150 MG 24 hr tablet bupropion HCl XL 150 mg 24 hr tablet, extended release    . colesevelam (WELCHOL) 625 MG tablet Take 1,250 mg by mouth daily.    . DENTA 5000 PLUS 1.1 % CREA dental cream BRUSH THOUROGHLY TWICE DAILY DO NOT RINSE EAT OR DRINK AFTERWARD    . EUTHYROX 112 MCG tablet Take 112 mcg by mouth daily.    Marland Kitchen levothyroxine (SYNTHROID) 112 MCG tablet Take by mouth.    . liothyronine (CYTOMEL) 5 MCG tablet Take by mouth.    . metFORMIN (GLUCOPHAGE-XR) 750 MG 24 hr tablet Take by mouth.    . Multiple Vitamin (MULTI-VITAMIN) tablet Take by mouth.    . NON FORMULARY Take 4 mLs by mouth daily. Naloxone Liquid for Hashimotos    . bacitracin ointment Apply topically. (Patient not taking: Reported on 07/20/2020)    . cyclobenzaprine (FLEXERIL) 5 MG tablet Take 5 mg by mouth 3 (three) times daily as needed. (Patient not taking: Reported on 07/20/2020)    . lidocaine-prilocaine (EMLA) cream Apply to Port-A-Cath site 30-60 minutes before treatment. (Patient not taking: Reported on 07/20/2020) 30 g 0  . prochlorperazine (COMPAZINE) 10 MG tablet Take 1 tablet (10 mg total) by mouth every 6 (six) hours as needed for nausea or vomiting. (Patient not taking: Reported on 07/20/2020) 30 tablet 0  . rosuvastatin (CRESTOR) 5 MG tablet Take 5 mg by mouth daily. (Patient not taking: Reported on 07/20/2020)    . sucralfate (CARAFATE) 1 g tablet Take 1 tablet (1 g total) by mouth 4 (four) times daily. Dissolve each tablet in 15 cc water before use. (Patient not taking: Reported on 07/20/2020) 120 tablet 2   No current facility-administered medications for this encounter.     REVIEW OF SYSTEMS: On review of systems, the patient reports that she is doing well and has not been having trouble with her systemic therapy. No specific complaints are noted.     PHYSICAL EXAM:  Unable to assess due to encounter type.  ECOG = 0  0 - Asymptomatic (Fully active, able to  carry on all predisease activities without restriction)  1 - Symptomatic but completely ambulatory (Restricted in physically strenuous activity but ambulatory and able to carry out work of a light or sedentary nature. For example, light housework, office work)  2 - Symptomatic, <50% in bed during the day (Ambulatory and capable of all self care but unable to carry out any work activities. Up and about more than 50% of waking hours)  3 - Symptomatic, >50% in bed, but not bedbound (Capable of only limited self-care, confined to bed or chair 50% or more of waking hours)  4 - Bedbound (Completely disabled. Cannot carry on any self-care. Totally confined to bed or chair)  5 - Death   Eustace Pen MM, Creech RH, Tormey DC, et al. [phone]). "Toxicity and response criteria of the [facility]". [place] Oncol. 5 (6): 649-55    LABORATORY DATA:  Lab Results  Component Value Date   WBC 5.5 09/09/2019   HGB 12.4 09/09/2019   HCT 37.7 09/09/2019   MCV 84.2 09/09/2019   PLT 117 (L) 09/09/2019   Lab Results  Component Value Date   NA 140 09/09/2019   K 4.2 09/09/2019   CL 106 09/09/2019   CO2 24 09/09/2019   Lab Results  Component Value Date   ALT 17 09/09/2019   AST 14 (L) 09/09/2019   ALKPHOS 57 09/09/2019   BILITOT 0.4 09/09/2019      RADIOGRAPHY: No results found.     IMPRESSION/PLAN: 1. Stage IIIB, cT2aN3M0, NSCLC, adenosquamous carcinoma of the RUL. We discussed the results of her recent PET scan on 07/14/20 and compared the reports and imaging from her prior PET scan before her radiation in March 2021. Unfortunately neither report calls out the sizes of the nodules, and neither call out both the  SUV and blood pool, so we are limited in our assessment. However the overall measurements are stable since her CT scans in October 2021, and in January 2022, and now by my measurement on the axial images from the PET scan from 07/14/20. Dr. Lisbeth Renshaw will also compare the  images personally and at this point we anticipate following her as the SUV of 3.6 is not definitively active disease. It could be post treatment effect from prior radiation or from her immunotherapy treatments. The only way to know for sure would be to biopsy the site, but she is aware of the limitations in doing this such as low yield specimen, and risks of the procedure. Given there does not appear to be concerns for disease elsewhere, we favor following her course with additional imaging. We will see her as needed moving forward.    Given current concerns for patient exposure during the COVID-19 pandemic, this encounter was conducted via telephone.  The patient has provided two factor identification and has given verbal consent for this type of encounter and has been advised to only accept a meeting of this type in a secure network environment. The time spent during this encounter was 60 minutes including preparation, discussion, and coordination of the patient's care. The attendants for this meeting include Dr. Lisbeth Renshaw, Hayden Pedro  and Nena Alexander.  During the encounter, Dr. Lisbeth Renshaw, was located at [facility] Radiation Oncology Department. Shona Simpson was located remotely at home. Altheria Shadoan was located at home.    The above documentation reflects my direct findings during this shared patient visit. Please see the separate note by Dr. Lisbeth Renshaw on this date for the remainder of the patient's plan of care.    Carola Rhine, PAC

## 2020-07-22 ENCOUNTER — Encounter: Payer: Self-pay | Admitting: Radiation Oncology

## 2020-09-30 ENCOUNTER — Encounter: Payer: Self-pay | Admitting: Internal Medicine

## 2020-10-01 ENCOUNTER — Other Ambulatory Visit (HOSPITAL_COMMUNITY): Payer: Self-pay | Admitting: Internal Medicine

## 2020-10-01 ENCOUNTER — Other Ambulatory Visit: Payer: Self-pay | Admitting: Internal Medicine

## 2020-10-01 DIAGNOSIS — C3490 Malignant neoplasm of unspecified part of unspecified bronchus or lung: Secondary | ICD-10-CM

## 2020-10-11 ENCOUNTER — Encounter: Payer: Self-pay | Admitting: Internal Medicine

## 2020-10-16 ENCOUNTER — Encounter: Payer: Self-pay | Admitting: Internal Medicine

## 2020-10-19 ENCOUNTER — Other Ambulatory Visit (HOSPITAL_COMMUNITY): Payer: Self-pay | Admitting: Internal Medicine

## 2020-10-19 ENCOUNTER — Other Ambulatory Visit: Payer: Self-pay | Admitting: Internal Medicine

## 2020-10-19 DIAGNOSIS — C7931 Secondary malignant neoplasm of brain: Secondary | ICD-10-CM

## 2020-10-19 DIAGNOSIS — R42 Dizziness and giddiness: Secondary | ICD-10-CM

## 2020-10-21 ENCOUNTER — Telehealth: Payer: Self-pay | Admitting: Internal Medicine

## 2020-10-21 NOTE — Telephone Encounter (Signed)
Scheduled appts per 7/27 sch msg. Pt aware.  

## 2020-10-22 ENCOUNTER — Ambulatory Visit (HOSPITAL_COMMUNITY)
Admission: RE | Admit: 2020-10-22 | Discharge: 2020-10-22 | Disposition: A | Discharge status: Home / Self Care | Payer: BC Managed Care – PPO | Source: Ambulatory Visit | Attending: Internal Medicine | Admitting: Internal Medicine

## 2020-10-22 ENCOUNTER — Other Ambulatory Visit: Payer: Self-pay | Admitting: Physician Assistant

## 2020-10-22 ENCOUNTER — Other Ambulatory Visit: Payer: Self-pay

## 2020-10-22 ENCOUNTER — Encounter (HOSPITAL_COMMUNITY)
Admission: RE | Admit: 2020-10-22 | Discharge: 2020-10-22 | Disposition: A | Discharge status: Home / Self Care | Payer: BC Managed Care – PPO | Source: Ambulatory Visit | Attending: Internal Medicine | Admitting: Internal Medicine

## 2020-10-22 DIAGNOSIS — C3490 Malignant neoplasm of unspecified part of unspecified bronchus or lung: Secondary | ICD-10-CM | POA: Insufficient documentation

## 2020-10-22 DIAGNOSIS — R42 Dizziness and giddiness: Secondary | ICD-10-CM

## 2020-10-22 DIAGNOSIS — C3491 Malignant neoplasm of unspecified part of right bronchus or lung: Secondary | ICD-10-CM

## 2020-10-22 DIAGNOSIS — C7931 Secondary malignant neoplasm of brain: Secondary | ICD-10-CM | POA: Diagnosis present

## 2020-10-22 LAB — GLUCOSE, CAPILLARY: Glucose-Capillary: 117 mg/dL — ABNORMAL HIGH (ref 70–99)

## 2020-10-22 MED ORDER — FLUDEOXYGLUCOSE F - 18 (FDG) INJECTION
9.0000 | Freq: Once | INTRAVENOUS | Status: AC | PRN
  Administered 2020-10-22: 7.1 via INTRAVENOUS

## 2020-10-22 MED ORDER — GADOBUTROL 1 MMOL/ML IV SOLN
7.0000 mL | Freq: Once | INTRAVENOUS | Status: AC | PRN
  Administered 2020-10-22: 7 mL via INTRAVENOUS

## 2020-10-25 ENCOUNTER — Other Ambulatory Visit: Payer: Self-pay

## 2020-10-25 ENCOUNTER — Inpatient Hospital Stay (HOSPITAL_BASED_OUTPATIENT_CLINIC_OR_DEPARTMENT_OTHER): Payer: BC Managed Care – PPO | Admitting: Internal Medicine

## 2020-10-25 ENCOUNTER — Inpatient Hospital Stay: Payer: BC Managed Care – PPO | Attending: Internal Medicine

## 2020-10-25 VITALS — BP 113/83 | HR 92 | Temp 97.1°F | Resp 20 | Ht 68.0 in | Wt 149.8 lb

## 2020-10-25 DIAGNOSIS — C3490 Malignant neoplasm of unspecified part of unspecified bronchus or lung: Secondary | ICD-10-CM

## 2020-10-25 DIAGNOSIS — C3411 Malignant neoplasm of upper lobe, right bronchus or lung: Secondary | ICD-10-CM

## 2020-10-25 DIAGNOSIS — C3491 Malignant neoplasm of unspecified part of right bronchus or lung: Secondary | ICD-10-CM

## 2020-10-25 DIAGNOSIS — Z5112 Encounter for antineoplastic immunotherapy: Secondary | ICD-10-CM

## 2020-10-25 LAB — CBC WITH DIFFERENTIAL (CANCER CENTER ONLY)
Abs Immature Granulocytes: 0 10*3/uL (ref 0.00–0.07)
Basophils Absolute: 0.1 10*3/uL (ref 0.0–0.1)
Basophils Relative: 1 %
Eosinophils Absolute: 0.2 10*3/uL (ref 0.0–0.5)
Eosinophils Relative: 3 %
HCT: 40.4 % (ref 36.0–46.0)
Hemoglobin: 13.3 g/dL (ref 12.0–15.0)
Immature Granulocytes: 0 %
Lymphocytes Relative: 13 %
Lymphs Abs: 0.7 10*3/uL (ref 0.7–4.0)
MCH: 27 pg (ref 26.0–34.0)
MCHC: 32.9 g/dL (ref 30.0–36.0)
MCV: 82.1 fL (ref 80.0–100.0)
Monocytes Absolute: 0.5 10*3/uL (ref 0.1–1.0)
Monocytes Relative: 10 %
Neutro Abs: 4.1 10*3/uL (ref 1.7–7.7)
Neutrophils Relative %: 73 %
Platelet Count: 198 10*3/uL (ref 150–400)
RBC: 4.92 MIL/uL (ref 3.87–5.11)
RDW: 16.4 % — ABNORMAL HIGH (ref 11.5–15.5)
WBC Count: 5.5 10*3/uL (ref 4.0–10.5)
nRBC: 0 % (ref 0.0–0.2)

## 2020-10-25 LAB — CMP (CANCER CENTER ONLY)
ALT: 18 U/L (ref 0–44)
AST: 17 U/L (ref 15–41)
Albumin: 4.2 g/dL (ref 3.5–5.0)
Alkaline Phosphatase: 47 U/L (ref 38–126)
Anion gap: 10 (ref 5–15)
BUN: 8 mg/dL (ref 6–20)
CO2: 23 mmol/L (ref 22–32)
Calcium: 9.9 mg/dL (ref 8.9–10.3)
Chloride: 109 mmol/L (ref 98–111)
Creatinine: 0.77 mg/dL (ref 0.44–1.00)
GFR, Estimated: >60 mL/min (ref >60)
Glucose, Bld: 115 mg/dL — ABNORMAL HIGH (ref 70–99)
Potassium: 4.2 mmol/L (ref 3.5–5.1)
Sodium: 142 mmol/L (ref 135–145)
Total Bilirubin: 0.6 mg/dL (ref 0.3–1.2)
Total Protein: 7 g/dL (ref 6.5–8.1)

## 2020-10-25 NOTE — Progress Notes (Signed)
[facility] [phone]   [phone]  OFFICE PROGRESS NOTE  Pcp, No No address on file   DIAGNOSIS: Stage IV (T1c, N3, M1c) non-small cell lung cancer, adenosquamous carcinoma presented with right upper lobe lung nodule in addition to mediastinal and supraclavicular lymphadenopathy diagnosed in March 2021.  The patient was also found to have metastatic disease to the brain.   PDL1: 100%   Guardant 360 Results: KRASG12V 0.2% Binimetinib   PRIOR THERAPY: Concurrent chemoradiation with weekly carboplatin for AUC of 2 and paclitaxel 45 MG/M2.  First dose Jul 28, 2019.  Status post 6 cycles.  Last fraction of radiation was given on 09/08/2019.  CURRENT THERAPY:  Immunotherapy with Imfinzi every 2 weeks done at [facility] in [facility] under the care of Dr. Koleen Nimrod started July 2021.  INTERVAL HISTORY: Pam Wilson 55 y.o. female returns to the clinic today for follow-up visit accompanied by her daughter.  The patient is feeling fine today with no concerning complaints except for mild fatigue.  She is also under a lot of stress and has some anxiety issues.  She was treated in the past with Wellbutrin by her primary care physician but this was discontinued while ago.  She denied having any current chest pain, shortness of breath, cough or hemoptysis.  She denied having any fever or chills.  She has no nausea, vomiting, diarrhea or constipation.  She denied having any headache or visual changes.  She had a PET scan as well as MRI of the brain performed recently and she is here for evaluation and discussion of her imaging studies and recommendation regarding her condition.  She would like to continue her care here in [place] especially after she finished her treatment with immunotherapy at [facility] likely later this month.  MEDICAL HISTORY: Past Medical History:  Diagnosis Date   Diverticulosis    Dyslipidemia     Hashimoto's thyroiditis    Hypothyroidism    Internal jugular vein thrombosis, right (HCC)    Mitral valve prolapse    Prediabetes     ALLERGIES:  is allergic to sulfamethoxazole-trimethoprim and penicillins.  MEDICATIONS:  Current Outpatient Medications  Medication Sig Dispense Refill   Abaloparatide (TYMLOS) 3120 MCG/1.56ML SOPN Tymlos 80 mcg/dose (3,120 mcg/1.56 mL) subcutaneous pen injector  Inject 80 micrograms every day by subcutaneous route for 30 days.     apixaban (ELIQUIS) 5 MG TABS tablet Take 1 tablet (5 mg total) by mouth 2 (two) times daily. 30 tablet 1   bacitracin ointment Apply topically. (Patient not taking: Reported on 07/20/2020)     buPROPion (WELLBUTRIN XL) 150 MG 24 hr tablet bupropion HCl XL 150 mg 24 hr tablet, extended release     colesevelam (WELCHOL) 625 MG tablet Take 1,250 mg by mouth daily.     cyclobenzaprine (FLEXERIL) 5 MG tablet Take 5 mg by mouth 3 (three) times daily as needed. (Patient not taking: Reported on 07/20/2020)     DENTA 5000 PLUS 1.1 % CREA dental cream BRUSH THOUROGHLY TWICE DAILY DO NOT RINSE EAT OR DRINK AFTERWARD     EUTHYROX 112 MCG tablet Take 112 mcg by mouth daily.     levothyroxine (SYNTHROID) 112 MCG tablet Take by mouth.     lidocaine-prilocaine (EMLA) cream Apply to Port-A-Cath site 30-60 minutes before treatment. (Patient not taking: Reported on 07/20/2020) 30 g 0   liothyronine (CYTOMEL) 5 MCG tablet Take by mouth.     metFORMIN (GLUCOPHAGE-XR)  750 MG 24 hr tablet Take by mouth.     Multiple Vitamin (MULTI-VITAMIN) tablet Take by mouth.     NON FORMULARY Take 4 mLs by mouth daily. Naloxone Liquid for Hashimotos     prochlorperazine (COMPAZINE) 10 MG tablet Take 1 tablet (10 mg total) by mouth every 6 (six) hours as needed for nausea or vomiting. (Patient not taking: Reported on 07/20/2020) 30 tablet 0   rosuvastatin (CRESTOR) 5 MG tablet Take 5 mg by mouth daily. (Patient not taking: Reported on 07/20/2020)     sucralfate  (CARAFATE) 1 g tablet Take 1 tablet (1 g total) by mouth 4 (four) times daily. Dissolve each tablet in 15 cc water before use. (Patient not taking: Reported on 07/20/2020) 120 tablet 2   No current facility-administered medications for this visit.    SURGICAL HISTORY: No past surgical history on file.  REVIEW OF SYSTEMS:  Constitutional: positive for fatigue Eyes: negative Ears, nose, mouth, throat, and face: negative Respiratory: negative Cardiovascular: negative Gastrointestinal: negative Genitourinary:negative Integument/breast: negative Hematologic/lymphatic: negative Musculoskeletal:negative Neurological: negative Behavioral/Psych: positive for anxiety Endocrine: negative Allergic/Immunologic: negative   PHYSICAL EXAMINATION: General appearance: alert, cooperative, fatigued, and no distress Head: Normocephalic, without obvious abnormality, atraumatic Neck: no adenopathy, no JVD, supple, symmetrical, trachea midline, and thyroid not enlarged, symmetric, no tenderness/mass/nodules Lymph nodes: Cervical, supraclavicular, and axillary nodes normal. Resp: clear to auscultation bilaterally Back: symmetric, no curvature. ROM normal. No CVA tenderness. Cardio: regular rate and rhythm, S1, S2 normal, no murmur, click, rub or gallop GI: soft, non-tender; bowel sounds normal; no masses,  no organomegaly Extremities: extremities normal, atraumatic, no cyanosis or edema Neurologic: Alert and oriented X 3, normal strength and tone. Normal symmetric reflexes. Normal coordination and gait  ECOG PERFORMANCE STATUS: 1 - Symptomatic but completely ambulatory  Blood pressure 113/83, pulse 92, temperature (!) 97.1 F (36.2 C), temperature source Tympanic, resp. rate 20, height 5' 8" (1.727 m), weight 149 lb 12.8 oz (67.9 kg), SpO2 97 %.  LABORATORY DATA: Lab Results  Component Value Date   WBC 5.5 10/25/2020   HGB 13.3 10/25/2020   HCT 40.4 10/25/2020   MCV 82.1 10/25/2020   PLT 198  10/25/2020      Chemistry      Component Value Date/Time   NA 140 09/09/2019 0922   K 4.2 09/09/2019 0922   CL 106 09/09/2019 0922   CO2 24 09/09/2019 0922   BUN 10 09/09/2019 0922   CREATININE 0.69 09/09/2019 0922      Component Value Date/Time   CALCIUM 9.4 09/09/2019 0922   ALKPHOS 57 09/09/2019 0922   AST 14 (L) 09/09/2019 0922   ALT 17 09/09/2019 0922   BILITOT 0.4 09/09/2019 0922       RADIOGRAPHIC STUDIES: MR BRAIN W WO CONTRAST  Result Date: 10/22/2020 CLINICAL DATA:  Lung cancer, follow-up EXAM: MRI HEAD WITHOUT AND WITH CONTRAST TECHNIQUE: Multiplanar, multiecho pulse sequences of the brain and surrounding structures were obtained without and with intravenous contrast. CONTRAST:  71m GADAVIST GADOBUTROL 1 MMOL/ML IV SOLN COMPARISON:  12/05/2019 FINDINGS: Brain: There is no acute infarction or intracranial hemorrhage. There is no intracranial mass, mass effect, or edema. There is no hydrocephalus or extra-axial fluid collection. Ventricles and sulci are normal in size and configuration. Small area of right middle frontal gyrus encephalomalacia is again noted. Small chronic right cerebellar infarct. No abnormal enhancement. Vascular: Major vessel flow voids at the skull base are preserved. Skull and upper cervical spine: Normal marrow signal is preserved. Sinuses/Orbits:  Paranasal sinuses are aerated. Orbits are unremarkable. Other: Sella is unremarkable.  Mastoid air cells are clear. IMPRESSION: No evidence of intracranial metastatic disease. Electronically Signed   By: Macy Mis M.D.   On: 10/22/2020 17:25   NM PET Image Initial (PI) Skull Base To Thigh  Result Date: 10/24/2020 CLINICAL DATA:  Subsequent treatment strategy for lung cancer. EXAM: NUCLEAR MEDICINE PET SKULL BASE TO THIGH TECHNIQUE: 7.1 mCi F-18 FDG was injected intravenously. Full-ring PET imaging was performed from the skull base to thigh after the radiotracer. CT data was obtained and used for attenuation  correction and anatomic localization. Fasting blood glucose: 117 mg/dl COMPARISON:  PET-CT 07/14/2020 and 06/21/2019 (no prior reports available for these studies.) FINDINGS: Mediastinal blood pool activity: SUV max 2.32 Liver activity: SUV max NA NECK: No hypermetabolic lymph nodes in the neck. Incidental CT findings: none CHEST: No FDG avid axillary, supraclavicular, mediastinal, or hilar lymph nodes. Bandlike area of ground-glass attenuation and fibrosis within the anterior and medial right upper lobe compatible with changes secondary to external beam radiation. Underlying nodular density appears less solid on today's study measuring 1.3 cm. The SUV max corresponding to this area is equal to 1.1, image 31/8. Right perihilar radiation changes also noted with mild radiotracer uptake, likely postinflammatory. SUV max is equal to 2.26. Tiny nodule within the posterolateral right upper lobe measures 4 mm and is too small to characterize by PET-CT, image 21/8. subpleural nodule within the anteromedial right middle lobe measures 4 mm, image 41/8. This is too small to reliably characterize. On 06/16/2019 this measured 8 mm. Incidental CT findings: Moderate changes of emphysema. Aortic atherosclerosis. Coronary artery calcifications noted. ABDOMEN/PELVIS: No abnormal hypermetabolic activity within the liver, pancreas, adrenal glands, or spleen. No hypermetabolic lymph nodes in the abdomen or pelvis. Incidental CT findings: Age advanced atherosclerotic changes involving the abdominal aorta. SKELETON: No focal hypermetabolic activity to suggest skeletal metastasis. Incidental CT findings: none IMPRESSION: 1. No highly specific findings identified to suggest recurrent FDG avid tumor or metastatic disease. 2. Sub-solid appearing nodule with surrounding changes of external beam radiation identified in the anterior medial right upper lobe compatible with treated tumor. Corresponding SUV max is equal to 1.1. 3. 4 mm nodule  within the subpleural aspect of the anteromedial right upper lobe is too small to characterize by PET-CT. When compared with 06/16/2019 this is decreased from 8 mm. 4. 4 mm nodule in the posterolateral right upper lobe is too small to characterize by PET-CT. This has a nonspecific appearance and is unchanged when compared with PET-CT from 07/19/2020. This is new however when compared with 06/16/2019 and warrants attention on future surveillance imaging. 5. Aortic Atherosclerosis (ICD10-I70.0) and Emphysema (ICD10-J43.9). Coronary artery calcifications. Electronically Signed   By: Kerby Moors M.D.   On: 10/24/2020 12:38    ASSESSMENT AND PLAN: This is a very pleasant 55 years old white female recently diagnosed with a stage IIIb non-small cell lung cancer, adenosquamous carcinoma presented with right upper lobe lung nodule in addition to mediastinal and supraclavicular lymphadenopathy diagnosed in March 2021. Her molecular studies by guardant 360 showed no actionable mutation except for K-ras G12V.  Her PD-L1 expression is 100%. The patient underwent a course of concurrent chemoradiation with weekly carboplatin for AUC of 2 and paclitaxel 45 mg/M2 status post 6 cycles.  She completed the course of radiotherapy on 09/08/2019. The patient had partial response to this treatment. She is currently undergoing consolidation treatment with immunotherapy with Imfinzi initially every 2 weeks and  now every 4-week at the [facility] in [facility] under the care of Dr. Koleen Nimrod.  She is tolerating the treatment well except for the fatigue.  She is almost about to complete her treatment for the 1 year of immunotherapy. She had a PET scan as well as MRI of the brain performed recently.  I personally and independently reviewed the scan images and discussed the results with the patient and her daughter. Her imaging studies showed no concerning findings for disease progression but there was a 4 mm  nodule in the posterior lateral right upper lobe that is too small to characterize by PET/CT and considered nonspecific in appearance and unchanged from the scan on July 19, 2020. I recommended for the patient to continue with her current immunotherapy until she complete 1 year of treatment.  After that she will be on observation with repeat imaging studies every few months at least for the first year. I will see her back for follow-up visit in 3 months with repeat CT scan of the chest for restaging of her disease. The patient was advised to call immediately if she has any other concerning symptoms in the interval.  The patient voices understanding of current disease status and treatment options and is in agreement with the current care plan.  All questions were answered. The patient knows to call the clinic with any problems, questions or concerns. We can certainly see the patient much sooner if necessary.  The total time spent in the appointment was 35 minutes.  Disclaimer: This note was dictated with voice recognition software. Similar sounding words can inadvertently be transcribed and may not be corrected upon review.

## 2020-10-27 ENCOUNTER — Telehealth: Payer: Self-pay | Admitting: Internal Medicine

## 2020-10-27 NOTE — Telephone Encounter (Signed)
Scheduled per los. Called and left msg. Mailed printout  °

## 2020-12-04 ENCOUNTER — Encounter: Payer: Self-pay | Admitting: Internal Medicine

## 2020-12-06 ENCOUNTER — Encounter: Payer: Self-pay | Admitting: Medical Oncology

## 2020-12-22 ENCOUNTER — Encounter: Payer: Self-pay | Admitting: Internal Medicine

## 2021-01-25 ENCOUNTER — Ambulatory Visit (HOSPITAL_COMMUNITY)
Admission: RE | Admit: 2021-01-25 | Discharge: 2021-01-25 | Disposition: A | Discharge status: Home / Self Care | Payer: BC Managed Care – PPO | Source: Ambulatory Visit | Attending: Internal Medicine | Admitting: Internal Medicine

## 2021-01-25 ENCOUNTER — Inpatient Hospital Stay: Payer: BC Managed Care – PPO | Attending: Internal Medicine

## 2021-01-25 ENCOUNTER — Other Ambulatory Visit: Payer: Self-pay

## 2021-01-25 DIAGNOSIS — C778 Secondary and unspecified malignant neoplasm of lymph nodes of multiple regions: Secondary | ICD-10-CM | POA: Insufficient documentation

## 2021-01-25 DIAGNOSIS — C3490 Malignant neoplasm of unspecified part of unspecified bronchus or lung: Secondary | ICD-10-CM | POA: Diagnosis present

## 2021-01-25 DIAGNOSIS — F419 Anxiety disorder, unspecified: Secondary | ICD-10-CM | POA: Insufficient documentation

## 2021-01-25 DIAGNOSIS — C7931 Secondary malignant neoplasm of brain: Secondary | ICD-10-CM | POA: Insufficient documentation

## 2021-01-25 DIAGNOSIS — C3411 Malignant neoplasm of upper lobe, right bronchus or lung: Secondary | ICD-10-CM | POA: Insufficient documentation

## 2021-01-25 LAB — CBC WITH DIFFERENTIAL (CANCER CENTER ONLY)
Abs Immature Granulocytes: 0.01 10*3/uL (ref 0.00–0.07)
Basophils Absolute: 0.1 10*3/uL (ref 0.0–0.1)
Basophils Relative: 1 %
Eosinophils Absolute: 0.1 10*3/uL (ref 0.0–0.5)
Eosinophils Relative: 3 %
HCT: 41.8 % (ref 36.0–46.0)
Hemoglobin: 13.8 g/dL (ref 12.0–15.0)
Immature Granulocytes: 0 %
Lymphocytes Relative: 14 %
Lymphs Abs: 0.7 10*3/uL (ref 0.7–4.0)
MCH: 28.2 pg (ref 26.0–34.0)
MCHC: 33 g/dL (ref 30.0–36.0)
MCV: 85.3 fL (ref 80.0–100.0)
Monocytes Absolute: 0.5 10*3/uL (ref 0.1–1.0)
Monocytes Relative: 10 %
Neutro Abs: 3.6 10*3/uL (ref 1.7–7.7)
Neutrophils Relative %: 72 %
Platelet Count: 205 10*3/uL (ref 150–400)
RBC: 4.9 MIL/uL (ref 3.87–5.11)
RDW: 14.7 % (ref 11.5–15.5)
WBC Count: 5 10*3/uL (ref 4.0–10.5)
nRBC: 0 % (ref 0.0–0.2)

## 2021-01-25 LAB — CMP (CANCER CENTER ONLY)
ALT: 21 U/L (ref 0–44)
AST: 20 U/L (ref 15–41)
Albumin: 4.4 g/dL (ref 3.5–5.0)
Alkaline Phosphatase: 43 U/L (ref 38–126)
Anion gap: 10 (ref 5–15)
BUN: 10 mg/dL (ref 6–20)
CO2: 25 mmol/L (ref 22–32)
Calcium: 9.6 mg/dL (ref 8.9–10.3)
Chloride: 106 mmol/L (ref 98–111)
Creatinine: 0.82 mg/dL (ref 0.44–1.00)
GFR, Estimated: >60 mL/min (ref >60)
Glucose, Bld: 109 mg/dL — ABNORMAL HIGH (ref 70–99)
Potassium: 4.1 mmol/L (ref 3.5–5.1)
Sodium: 141 mmol/L (ref 135–145)
Total Bilirubin: 0.4 mg/dL (ref 0.3–1.2)
Total Protein: 7.2 g/dL (ref 6.5–8.1)

## 2021-01-25 MED ORDER — IOHEXOL 350 MG/ML SOLN
60.0000 mL | Freq: Once | INTRAVENOUS | Status: AC | PRN
  Administered 2021-01-25: 60 mL via INTRAVENOUS

## 2021-01-27 ENCOUNTER — Other Ambulatory Visit: Payer: Self-pay

## 2021-01-27 ENCOUNTER — Encounter: Payer: Self-pay | Admitting: Internal Medicine

## 2021-01-27 ENCOUNTER — Inpatient Hospital Stay (HOSPITAL_BASED_OUTPATIENT_CLINIC_OR_DEPARTMENT_OTHER): Payer: BC Managed Care – PPO | Admitting: Internal Medicine

## 2021-01-27 VITALS — BP 101/77 | HR 98 | Temp 98.1°F | Resp 20 | Ht 68.0 in | Wt 147.8 lb

## 2021-01-27 DIAGNOSIS — C3490 Malignant neoplasm of unspecified part of unspecified bronchus or lung: Secondary | ICD-10-CM

## 2021-01-27 DIAGNOSIS — C3411 Malignant neoplasm of upper lobe, right bronchus or lung: Secondary | ICD-10-CM | POA: Diagnosis not present

## 2021-01-27 DIAGNOSIS — C3431 Malignant neoplasm of lower lobe, right bronchus or lung: Secondary | ICD-10-CM

## 2021-01-27 DIAGNOSIS — C778 Secondary and unspecified malignant neoplasm of lymph nodes of multiple regions: Secondary | ICD-10-CM | POA: Diagnosis not present

## 2021-01-27 DIAGNOSIS — C7931 Secondary malignant neoplasm of brain: Secondary | ICD-10-CM | POA: Diagnosis not present

## 2021-01-27 DIAGNOSIS — C3491 Malignant neoplasm of unspecified part of right bronchus or lung: Secondary | ICD-10-CM

## 2021-01-27 DIAGNOSIS — F419 Anxiety disorder, unspecified: Secondary | ICD-10-CM | POA: Diagnosis not present

## 2021-01-27 NOTE — Progress Notes (Signed)
[name] [phone]   [phone]  OFFICE PROGRESS NOTE  [name], DO [name] [id number]   DIAGNOSIS: Stage IV (T1c, N3, M1c) non-small cell lung cancer, adenosquamous carcinoma presented with right upper lobe lung nodule in addition to mediastinal and supraclavicular lymphadenopathy diagnosed in March 2021.  The patient was also found to have metastatic disease to the brain.   PDL1: 100%   Guardant 360 Results: KRASG12V 0.2% Binimetinib   PRIOR THERAPY: Concurrent chemoradiation with weekly carboplatin for AUC of 2 and paclitaxel 45 MG/M2.  First dose Jul 28, 2019.  Status post 6 cycles.  Last fraction of radiation was given on 09/08/2019.  CURRENT THERAPY:  Immunotherapy with Imfinzi every 2 weeks done at [facility] in [facility] under the care of Dr. Koleen Nimrod started July 2021.  She completed 1 year of treatment 2 weeks ago.  INTERVAL HISTORY: Pam Wilson 55 y.o. female returns to the clinic today for follow-up visit.  The patient is feeling fine today with no concerning complaints except for some anxiety.  She denied having any current chest pain, shortness of breath, cough or hemoptysis.  She denied having any fever or chills.  She has no nausea, vomiting, diarrhea or constipation.  She denied having any headache or visual changes.  She completed the course of consolidation treatment with immunotherapy 2 weeks ago in [place].  The patient is very active and she exercises regular basis.  She has known compression fracture in the back and she is working with her primary care physician on adjustment of her medications.  The patient had repeat CT scan of the chest performed recently and she is here for evaluation and discussion of her scan results.  MEDICAL HISTORY: Past Medical History:  Diagnosis Date   Diverticulosis    Dyslipidemia    Hashimoto's thyroiditis     Hypothyroidism    Internal jugular vein thrombosis, right (HCC)    Mitral valve prolapse    Prediabetes     ALLERGIES:  is allergic to sulfamethoxazole-trimethoprim and penicillins.  MEDICATIONS:  Current Outpatient Medications  Medication Sig Dispense Refill   Abaloparatide (TYMLOS) 3120 MCG/1.56ML SOPN Tymlos 80 mcg/dose (3,120 mcg/1.56 mL) subcutaneous pen injector  Inject 80 micrograms every day by subcutaneous route for 30 days.     apixaban (ELIQUIS) 5 MG TABS tablet Take 1 tablet (5 mg total) by mouth 2 (two) times daily. 30 tablet 1   bacitracin ointment Apply topically. (Patient not taking: Reported on 07/20/2020)     buPROPion (WELLBUTRIN XL) 150 MG 24 hr tablet bupropion HCl XL 150 mg 24 hr tablet, extended release     colesevelam (WELCHOL) 625 MG tablet Take 1,250 mg by mouth daily.     cyclobenzaprine (FLEXERIL) 5 MG tablet Take 5 mg by mouth 3 (three) times daily as needed. (Patient not taking: Reported on 07/20/2020)     DENTA 5000 PLUS 1.1 % CREA dental cream BRUSH THOUROGHLY TWICE DAILY DO NOT RINSE EAT OR DRINK AFTERWARD     EUTHYROX 112 MCG tablet Take 112 mcg by mouth daily.     levothyroxine (SYNTHROID) 112 MCG tablet Take by mouth.     lidocaine-prilocaine (EMLA) cream Apply to Port-A-Cath site 30-60 minutes before treatment. (Patient not taking: Reported on 07/20/2020) 30 g 0   liothyronine (CYTOMEL) 5 MCG tablet Take by mouth.     metFORMIN (GLUCOPHAGE-XR) 750 MG 24 hr  tablet Take by mouth.     Multiple Vitamin (MULTI-VITAMIN) tablet Take by mouth.     NON FORMULARY Take 4 mLs by mouth daily. Naloxone Liquid for Hashimotos     prochlorperazine (COMPAZINE) 10 MG tablet Take 1 tablet (10 mg total) by mouth every 6 (six) hours as needed for nausea or vomiting. (Patient not taking: Reported on 07/20/2020) 30 tablet 0   rosuvastatin (CRESTOR) 5 MG tablet Take 5 mg by mouth daily. (Patient not taking: Reported on 07/20/2020)     sucralfate (CARAFATE) 1 g tablet Take 1  tablet (1 g total) by mouth 4 (four) times daily. Dissolve each tablet in 15 cc water before use. (Patient not taking: Reported on 07/20/2020) 120 tablet 2   No current facility-administered medications for this visit.    SURGICAL HISTORY: No past surgical history on file.  REVIEW OF SYSTEMS:  Constitutional: positive for fatigue Eyes: negative Ears, nose, mouth, throat, and face: negative Respiratory: negative Cardiovascular: negative Gastrointestinal: negative Genitourinary:negative Integument/breast: negative Hematologic/lymphatic: negative Musculoskeletal:negative Neurological: negative Behavioral/Psych: positive for anxiety Endocrine: negative Allergic/Immunologic: negative   PHYSICAL EXAMINATION: General appearance: alert, cooperative, fatigued, and no distress Head: Normocephalic, without obvious abnormality, atraumatic Neck: no adenopathy, no JVD, supple, symmetrical, trachea midline, and thyroid not enlarged, symmetric, no tenderness/mass/nodules Lymph nodes: Cervical, supraclavicular, and axillary nodes normal. Resp: clear to auscultation bilaterally Back: symmetric, no curvature. ROM normal. No CVA tenderness. Cardio: regular rate and rhythm, S1, S2 normal, no murmur, click, rub or gallop GI: soft, non-tender; bowel sounds normal; no masses,  no organomegaly Extremities: extremities normal, atraumatic, no cyanosis or edema Neurologic: Alert and oriented X 3, normal strength and tone. Normal symmetric reflexes. Normal coordination and gait  ECOG PERFORMANCE STATUS: 1 - Symptomatic but completely ambulatory  Blood pressure 101/77, pulse 98, temperature 98.1 F (36.7 C), temperature source Tympanic, resp. rate 20, height _0  (1.727 m), weight 147 lb 12.8 oz (67 kg), SpO2 96 %.  LABORATORY DATA: Lab Results  Component Value Date   WBC 5.0 01/25/2021   HGB 13.8 01/25/2021   HCT 41.8 01/25/2021   MCV 85.3 01/25/2021   PLT 205 01/25/2021      Chemistry       Component Value Date/Time   NA 141 01/25/2021 0916   K 4.1 01/25/2021 0916   CL 106 01/25/2021 0916   CO2 25 01/25/2021 0916   BUN 10 01/25/2021 0916   CREATININE 0.82 01/25/2021 0916      Component Value Date/Time   CALCIUM 9.6 01/25/2021 0916   ALKPHOS 43 01/25/2021 0916   AST 20 01/25/2021 0916   ALT 21 01/25/2021 0916   BILITOT 0.4 01/25/2021 0916       RADIOGRAPHIC STUDIES: CT Chest W Contrast  Result Date: 01/26/2021 CLINICAL DATA:  55 year old female with history of non-small cell lung cancer. Therapy completed. Follow-up study. EXAM: CT CHEST WITH CONTRAST TECHNIQUE: Multidetector CT imaging of the chest was performed during intravenous contrast administration. CONTRAST:  51m OMNIPAQUE IOHEXOL 350 MG/ML SOLN COMPARISON:  PET-CT 10/22/2020.  Chest CT 06/16/2019. FINDINGS: Cardiovascular: Heart size is normal. There is no significant pericardial fluid, thickening or pericardial calcification. There is aortic atherosclerosis, as well as atherosclerosis of the great vessels of the mediastinum and the coronary arteries, including calcified atherosclerotic plaque in the left main, left anterior descending and right coronary arteries. Left subclavian single-lumen porta cath with tip terminating in the distal superior vena cava. Mediastinum/Nodes: No pathologically enlarged mediastinal or hilar lymph nodes. Esophagus is unremarkable in  appearance. No axillary lymphadenopathy. Lungs/Pleura: Treated right upper lobe nodule is now an ill-defined area of nodular architectural distortion (axial image 62 of series 5) with surrounding areas of septal thickening, similar in overall size and appearance to prior examination 10/22/2020. Septal thickening and architectural distortion extends toward the right hilar region in the right upper lobe, as well as some additional areas in the medial aspect of the right lower lobe, all of which likely reflect evolving postradiation changes. No definite suspicious  appearing pulmonary nodules or masses are noted. No acute consolidative airspace disease. No pleural effusions. Diffuse bronchial wall thickening with mild centrilobular and paraseptal emphysema. Linear scarring in the lower lobes of the lungs bilaterally. Upper Abdomen: Aortic atherosclerosis. Musculoskeletal: There are no aggressive appearing lytic or blastic lesions noted in the visualized portions of the skeleton. Compression fractures are now noted involving the superior endplate of T2, T4, T7 superior endplate, T8 superior endplate and T9 superior endplate, most severe at T8 where there is 70% loss of anterior vertebral body height. None of these appear acute, but they are new when compared to prior study from 06/16/2019. IMPRESSION: 1. Treated right upper lobe pulmonary nodule is ill-defined, appears similar to prior PET-CT, and there are some adjacent areas of postradiation fibrosis in the right lung, as discussed above. Today's study will serve as a new baseline for future follow-up examinations. No definitive findings to clearly indicate residual/recurrent disease or metastatic disease in the thorax. 2. Multiple new compression fractures throughout the thoracic spine, as discussed above. These do not appear acute. 3. Mild diffuse bronchial wall thickening with mild centrilobular and paraseptal emphysema; imaging findings suggestive of underlying COPD. 4. Aortic atherosclerosis, in addition to left main and 3 vessel coronary artery disease. Please note that although the presence of coronary artery calcium documents the presence of coronary artery disease, the severity of this disease and any potential stenosis cannot be assessed on this non-gated CT examination. Assessment for potential risk factor modification, dietary therapy or pharmacologic therapy may be warranted, if clinically indicated. Aortic Atherosclerosis (ICD10-I70.0) and Emphysema (ICD10-J43.9). Electronically Signed   By: Vinnie Langton  M.D.   On: 01/26/2021 06:05    ASSESSMENT AND PLAN: This is a very pleasant 55 years old white female recently diagnosed with a stage IIIb non-small cell lung cancer, adenosquamous carcinoma presented with right upper lobe lung nodule in addition to mediastinal and supraclavicular lymphadenopathy diagnosed in March 2021. Her molecular studies by guardant 360 showed no actionable mutation except for K-ras G12V.  Her PD-L1 expression is 100%. The patient underwent a course of concurrent chemoradiation with weekly carboplatin for AUC of 2 and paclitaxel 45 mg/M2 status post 6 cycles.  She completed the course of radiotherapy on 09/08/2019. The patient had partial response to this treatment. She completed a course of consolidation treatment with immunotherapy with Imfinzi in [facility] 2 weeks ago.  The patient tolerated her treatment well with no concerning adverse effects. She had a repeat CT scan of the chest performed recently.  I personally and independently reviewed the scan and discussed the results with the patient today. Her scan showed no concerning findings for disease recurrence or metastasis but it showed multiple new compression fractures throughout the thoracic spine involving the superior endplate of T2, T4, T7 superior endplate, T8 superior endplate and T9 superior endplate but most severe at T8. The patient is currently working with her primary care physician and adjustment of her medications.  She may also need a  bone density assay and treatment with calcium and biphosphonate or Prolia but I will leave this to her primary care physician. I recommended for the patient to continue on observation with repeat CT scan of the chest in 3 months. She was advised to call immediately if she has any other concerning symptoms in the interval.  The patient voices understanding of current disease status and treatment options and is in agreement with the current care plan.  All  questions were answered. The patient knows to call the clinic with any problems, questions or concerns. We can certainly see the patient much sooner if necessary.  The total time spent in the appointment was 30 minutes.  Disclaimer: This note was dictated with voice recognition software. Similar sounding words can inadvertently be transcribed and may not be corrected upon review.

## 2021-01-28 ENCOUNTER — Encounter: Payer: Self-pay | Admitting: Internal Medicine

## 2021-02-01 ENCOUNTER — Encounter: Payer: Self-pay | Admitting: Internal Medicine

## 2021-04-27 ENCOUNTER — Encounter: Payer: Self-pay | Admitting: Internal Medicine

## 2021-04-28 ENCOUNTER — Other Ambulatory Visit: Payer: Self-pay | Admitting: *Deleted

## 2021-04-28 DIAGNOSIS — E039 Hypothyroidism, unspecified: Secondary | ICD-10-CM

## 2021-04-28 NOTE — Progress Notes (Signed)
Chart review.

## 2021-05-03 ENCOUNTER — Ambulatory Visit (HOSPITAL_COMMUNITY)
Admission: RE | Admit: 2021-05-03 | Discharge: 2021-05-03 | Disposition: A | Discharge status: Home / Self Care | Payer: BC Managed Care – PPO | Source: Ambulatory Visit | Attending: Internal Medicine | Admitting: Internal Medicine

## 2021-05-03 ENCOUNTER — Inpatient Hospital Stay: Payer: BC Managed Care – PPO | Attending: Internal Medicine

## 2021-05-03 ENCOUNTER — Other Ambulatory Visit: Payer: Self-pay

## 2021-05-03 ENCOUNTER — Encounter (HOSPITAL_COMMUNITY): Payer: Self-pay

## 2021-05-03 DIAGNOSIS — R0609 Other forms of dyspnea: Secondary | ICD-10-CM | POA: Insufficient documentation

## 2021-05-03 DIAGNOSIS — C3490 Malignant neoplasm of unspecified part of unspecified bronchus or lung: Secondary | ICD-10-CM | POA: Diagnosis not present

## 2021-05-03 DIAGNOSIS — C3411 Malignant neoplasm of upper lobe, right bronchus or lung: Secondary | ICD-10-CM | POA: Insufficient documentation

## 2021-05-03 DIAGNOSIS — R5383 Other fatigue: Secondary | ICD-10-CM | POA: Insufficient documentation

## 2021-05-03 LAB — CMP (CANCER CENTER ONLY)
ALT: 23 U/L (ref 0–44)
AST: 20 U/L (ref 15–41)
Albumin: 4.6 g/dL (ref 3.5–5.0)
Alkaline Phosphatase: 47 U/L (ref 38–126)
Anion gap: 7 (ref 5–15)
BUN: 17 mg/dL (ref 6–20)
CO2: 28 mmol/L (ref 22–32)
Calcium: 9.8 mg/dL (ref 8.9–10.3)
Chloride: 103 mmol/L (ref 98–111)
Creatinine: 0.78 mg/dL (ref 0.44–1.00)
GFR, Estimated: >60 mL/min (ref >60)
Glucose, Bld: 94 mg/dL (ref 70–99)
Potassium: 4.3 mmol/L (ref 3.5–5.1)
Sodium: 138 mmol/L (ref 135–145)
Total Bilirubin: 0.3 mg/dL (ref 0.3–1.2)
Total Protein: 7.1 g/dL (ref 6.5–8.1)

## 2021-05-03 LAB — CBC WITH DIFFERENTIAL (CANCER CENTER ONLY)
Abs Immature Granulocytes: 0.01 10*3/uL (ref 0.00–0.07)
Basophils Absolute: 0.1 10*3/uL (ref 0.0–0.1)
Basophils Relative: 1 %
Eosinophils Absolute: 0.2 10*3/uL (ref 0.0–0.5)
Eosinophils Relative: 3 %
HCT: 38 % (ref 36.0–46.0)
Hemoglobin: 12.5 g/dL (ref 12.0–15.0)
Immature Granulocytes: 0 %
Lymphocytes Relative: 15 %
Lymphs Abs: 1 10*3/uL (ref 0.7–4.0)
MCH: 27.5 pg (ref 26.0–34.0)
MCHC: 32.9 g/dL (ref 30.0–36.0)
MCV: 83.7 fL (ref 80.0–100.0)
Monocytes Absolute: 0.8 10*3/uL (ref 0.1–1.0)
Monocytes Relative: 13 %
Neutro Abs: 4.4 10*3/uL (ref 1.7–7.7)
Neutrophils Relative %: 68 %
Platelet Count: 211 10*3/uL (ref 150–400)
RBC: 4.54 MIL/uL (ref 3.87–5.11)
RDW: 14.6 % (ref 11.5–15.5)
WBC Count: 6.4 10*3/uL (ref 4.0–10.5)
nRBC: 0 % (ref 0.0–0.2)

## 2021-05-03 MED ORDER — IOHEXOL 300 MG/ML  SOLN
75.0000 mL | Freq: Once | INTRAMUSCULAR | Status: AC | PRN
  Administered 2021-05-03: 75 mL via INTRAVENOUS

## 2021-05-03 MED ORDER — SODIUM CHLORIDE (PF) 0.9 % IJ SOLN
INTRAMUSCULAR | Status: AC
  Filled 2021-05-03: qty 50

## 2021-05-03 MED ORDER — HEPARIN SOD (PORK) LOCK FLUSH 100 UNIT/ML IV SOLN
500.0000 [IU] | Freq: Once | INTRAVENOUS | Status: AC
  Administered 2021-05-03: 500 [IU] via INTRAVENOUS

## 2021-05-03 MED ORDER — HEPARIN SOD (PORK) LOCK FLUSH 100 UNIT/ML IV SOLN
INTRAVENOUS | Status: AC
  Filled 2021-05-03: qty 5

## 2021-05-05 ENCOUNTER — Other Ambulatory Visit: Payer: Self-pay

## 2021-05-05 ENCOUNTER — Inpatient Hospital Stay (HOSPITAL_BASED_OUTPATIENT_CLINIC_OR_DEPARTMENT_OTHER): Payer: BC Managed Care – PPO | Admitting: Internal Medicine

## 2021-05-05 VITALS — BP 133/86 | HR 87 | Temp 97.7°F | Resp 18 | Ht 68.0 in | Wt 156.6 lb

## 2021-05-05 DIAGNOSIS — C3490 Malignant neoplasm of unspecified part of unspecified bronchus or lung: Secondary | ICD-10-CM | POA: Diagnosis not present

## 2021-05-05 DIAGNOSIS — C3411 Malignant neoplasm of upper lobe, right bronchus or lung: Secondary | ICD-10-CM

## 2021-05-05 DIAGNOSIS — R5383 Other fatigue: Secondary | ICD-10-CM | POA: Diagnosis not present

## 2021-05-05 DIAGNOSIS — R0609 Other forms of dyspnea: Secondary | ICD-10-CM | POA: Diagnosis not present

## 2021-05-05 NOTE — Progress Notes (Signed)
[name] [phone]   [phone]  OFFICE PROGRESS NOTE  [name], DO [place] [phone]   DIAGNOSIS: Stage IV (T1c, N3, M1c) non-small cell lung cancer, adenosquamous carcinoma presented with right upper lobe lung nodule in addition to mediastinal and supraclavicular lymphadenopathy diagnosed in March 2021.  The patient was also found to have metastatic disease to the brain.   PDL1: 100%   Guardant 360 Results: KRASG12V 0.2% Binimetinib   PRIOR THERAPY:  1) Concurrent chemoradiation with weekly carboplatin for AUC of 2 and paclitaxel 45 MG/M2.  First dose Jul 28, 2019.  Status post 6 cycles.  Last fraction of radiation was given on 09/08/2019. 2) Immunotherapy with Imfinzi every 2 weeks done at [facility] in [facility] under the care of Dr. Koleen Nimrod started July 2021.  She completed 1 year of treatment.  CURRENT THERAPY: Observation  INTERVAL HISTORY: Pam Wilson 56 y.o. female returns to the clinic today for follow-up visit.  The patient is feeling fine today with no concerning complaints except for mild fatigue and shortness of breath with exertion.  She also continues to have some anxiety about her lung cancer.  She is expecting a grandson in the next few weeks.  She denied having any current chest pain, cough or hemoptysis.  She denied having any fever or chills.  She has no nausea, vomiting, diarrhea or constipation.  She has no headache or visual changes.  She denied having any significant weight loss or night sweats.  She is here today for evaluation with repeat CT scan of the chest for restaging of her disease.  MEDICAL HISTORY: Past Medical History:  Diagnosis Date   Diverticulosis    Dyslipidemia    Hashimoto's thyroiditis    Hypothyroidism    Internal jugular vein thrombosis, right (HCC)    Mitral valve prolapse    Prediabetes     ALLERGIES:  is  allergic to sulfamethoxazole-trimethoprim, codeine, and penicillins.  MEDICATIONS:  Current Outpatient Medications  Medication Sig Dispense Refill   apixaban (ELIQUIS) 5 MG TABS tablet Take 1 tablet (5 mg total) by mouth 2 (two) times daily. 30 tablet 1   colesevelam (WELCHOL) 625 MG tablet Take 1,250 mg by mouth daily.     DENTA 5000 PLUS 1.1 % CREA dental cream BRUSH THOUROGHLY TWICE DAILY DO NOT RINSE EAT OR DRINK AFTERWARD     levothyroxine (SYNTHROID) 112 MCG tablet Take by mouth.     liothyronine (CYTOMEL) 5 MCG tablet Take by mouth.     metFORMIN (GLUCOPHAGE-XR) 750 MG 24 hr tablet Take by mouth.     Multiple Vitamin (MULTI-VITAMIN) tablet Take by mouth.     NON FORMULARY Take 4 mLs by mouth daily. Naloxone Liquid for Hashimotos     traMADol (ULTRAM) 50 MG tablet Take 50 mg by mouth every 6 (six) hours.     tretinoin (RETIN-A) 0.1 % cream Apply 1 application topically at bedtime.     No current facility-administered medications for this visit.    SURGICAL HISTORY:  Past Surgical History:  Procedure Laterality Date   port Left 05/04/2019    REVIEW OF SYSTEMS:  A comprehensive review of systems was negative except for: Constitutional: positive for fatigue Respiratory: positive for dyspnea on exertion   PHYSICAL EXAMINATION: General appearance: alert, cooperative, fatigued, and no distress Head: Normocephalic, without obvious abnormality, atraumatic Neck: no adenopathy, no JVD, supple, symmetrical, trachea midline,  and thyroid not enlarged, symmetric, no tenderness/mass/nodules Lymph nodes: Cervical, supraclavicular, and axillary nodes normal. Resp: clear to auscultation bilaterally Back: symmetric, no curvature. ROM normal. No CVA tenderness. Cardio: regular rate and rhythm, S1, S2 normal, no murmur, click, rub or gallop GI: soft, non-tender; bowel sounds normal; no masses,  no organomegaly Extremities: extremities normal, atraumatic, no cyanosis or edema  ECOG PERFORMANCE  STATUS: 1 - Symptomatic but completely ambulatory  Blood pressure 133/86, pulse 87, temperature 97.7 F (36.5 C), temperature source Tympanic, resp. rate 18, height 5' 8"  (1.727 m), weight 156 lb 9.6 oz (71 kg), SpO2 95 %.  LABORATORY DATA: Lab Results  Component Value Date   WBC 6.4 05/03/2021   HGB 12.5 05/03/2021   HCT 38.0 05/03/2021   MCV 83.7 05/03/2021   PLT 211 05/03/2021      Chemistry      Component Value Date/Time   NA 138 05/03/2021 0919   K 4.3 05/03/2021 0919   CL 103 05/03/2021 0919   CO2 28 05/03/2021 0919   BUN 17 05/03/2021 0919   CREATININE 0.78 05/03/2021 0919      Component Value Date/Time   CALCIUM 9.8 05/03/2021 0919   ALKPHOS 47 05/03/2021 0919   AST 20 05/03/2021 0919   ALT 23 05/03/2021 0919   BILITOT 0.3 05/03/2021 0919       RADIOGRAPHIC STUDIES: CT Chest W Contrast  Result Date: 05/04/2021 CLINICAL DATA:  56 year old female with history of non-small cell lung cancer. Status post chemotherapy and radiation therapy completed in June 2021. Immunotherapy ongoing. Staging examination. EXAM: CT CHEST WITH CONTRAST TECHNIQUE: Multidetector CT imaging of the chest was performed during intravenous contrast administration. RADIATION DOSE REDUCTION: This exam was performed according to the departmental dose-optimization program which includes automated exposure control, adjustment of the mA and/or kV according to patient size and/or use of iterative reconstruction technique. CONTRAST:  39m OMNIPAQUE IOHEXOL 300 MG/ML  SOLN COMPARISON:  Chest CT 01/25/2021.  PET-CT 10/22/2020. FINDINGS: Cardiovascular: Heart size is normal. There is no significant pericardial fluid, thickening or pericardial calcification. There is aortic atherosclerosis, as well as atherosclerosis of the great vessels of the mediastinum and the coronary arteries, including calcified atherosclerotic plaque in the left main, left anterior descending, left circumflex and right coronary arteries.  Left-sided subclavian single-lumen porta cath with tip terminating at the superior cavoatrial junction. Mediastinum/Nodes: No pathologically enlarged mediastinal or hilar lymph nodes. Esophagus is unremarkable in appearance. No axillary lymphadenopathy. Lungs/Pleura: Again noted is an area of architectural distortion and volume loss in the anterior aspect of the right upper lobe. Within the center of this region (axial image 58 of series 5) there is a more well-defined nodular density which currently measures 1.6 x 1.4 cm. No other suspicious appearing pulmonary nodules or masses are noted. No acute consolidative airspace disease. No pleural effusions. Linear areas of scarring are again noted in the lower lobes of the lungs bilaterally. Diffuse bronchial wall thickening with moderate centrilobular and paraseptal emphysema. Upper Abdomen: Aortic atherosclerosis. Musculoskeletal: Multiple chronic compression fractures of T2, T4, T7, T8 and T9 are again noted, most severe at T8 where there is 50% loss of anterior vertebral body height. There are no aggressive appearing lytic or blastic lesions noted in the visualized portions of the skeleton. IMPRESSION: 1. Continued evolution of treated lesion in the right upper lobe with evolving postradiation changes. Central nodular area on today's study is favored to reflect an area of more confluent postradiation fibrosis, but continued close attention to this  region on follow-up imaging is strongly recommended to exclude the possibility of residual/recurrent disease. No lymphadenopathy or new pulmonary nodules to suggest metastatic disease in the thorax. 2. Diffuse bronchial wall thickening with moderate centrilobular and paraseptal emphysema; imaging findings suggestive of underlying COPD. 3. Aortic atherosclerosis, in addition to left main and three-vessel coronary artery disease. Please note that although the presence of coronary artery calcium documents the presence of  coronary artery disease, the severity of this disease and any potential stenosis cannot be assessed on this non-gated CT examination. Assessment for potential risk factor modification, dietary therapy or pharmacologic therapy may be warranted, if clinically indicated. Aortic Atherosclerosis (ICD10-I70.0) and Emphysema (ICD10-J43.9). Electronically Signed   By: Vinnie Langton M.D.   On: 05/04/2021 09:07    ASSESSMENT AND PLAN: This is a very pleasant 56 years old white female recently diagnosed with a stage IIIb non-small cell lung cancer, adenosquamous carcinoma presented with right upper lobe lung nodule in addition to mediastinal and supraclavicular lymphadenopathy diagnosed in March 2021. Her molecular studies by guardant 360 showed no actionable mutation except for K-ras G12V.  Her PD-L1 expression is 100%. The patient underwent a course of concurrent chemoradiation with weekly carboplatin for AUC of 2 and paclitaxel 45 mg/M2 status post 6 cycles.  She completed the course of radiotherapy on 09/08/2019. The patient had partial response to this treatment. She completed a course of consolidation treatment with immunotherapy with Imfinzi in [facility] in November 2022. The patient has been on observation and she is feeling fine with no concerning complaints except for mild fatigue and shortness of breath with exertion. She had repeat CT scan of the chest performed recently.  I personally and independently reviewed the scan images and discussed the results with the patient today. Her scan showed no concerning findings for disease progression but she continues to have involving radiation changes. I recommended for her to continue on observation with repeat CT scan of the chest in 6 months. For the compression fracture, she is currently followed by her primary care physician and she is working on adjusting her medication with calcium supplement as well as Prolia.  She is not interested in  vertebroplasty at this point. I will arrange for the patient to have Port-A-Cath flush every 2 months. She was advised to call immediately if she has any other concerning symptoms in the interval.  The patient voices understanding of current disease status and treatment options and is in agreement with the current care plan.  All questions were answered. The patient knows to call the clinic with any problems, questions or concerns. We can certainly see the patient much sooner if necessary.  Disclaimer: This note was dictated with voice recognition software. Similar sounding words can inadvertently be transcribed and may not be corrected upon review.

## 2021-05-31 ENCOUNTER — Telehealth: Payer: Self-pay

## 2021-05-31 NOTE — Telephone Encounter (Signed)
Pam Wilson w/[employer] called stating it was a courtesy call to advise pt has disability hearing coming up and Dr. Evelina Bucy, with review the disability claim. In the event Dr. Julien Nordmann has anything additional he would like to add, the contact number for [facility] to provide additional information is [phone]. ?

## 2021-06-16 ENCOUNTER — Telehealth: Payer: Self-pay

## 2021-06-16 NOTE — Telephone Encounter (Signed)
Call received from [place] requesting if pt is okay to d/c her Eliquis for her colonoscopy. ? ?I have discussed this with Dr. Julien Nordmann who advised pt is okay to hold Eliquis for two day. This has been relayed to [facility]. ?

## 2021-06-28 ENCOUNTER — Encounter: Payer: Self-pay | Admitting: Internal Medicine

## 2021-06-28 ENCOUNTER — Other Ambulatory Visit: Payer: Self-pay

## 2021-07-01 ENCOUNTER — Inpatient Hospital Stay: Payer: BC Managed Care – PPO

## 2021-08-01 ENCOUNTER — Telehealth: Payer: Self-pay | Admitting: *Deleted

## 2021-08-01 NOTE — Telephone Encounter (Signed)
Error

## 2021-09-14 ENCOUNTER — Other Ambulatory Visit: Payer: Self-pay | Admitting: Medical Oncology

## 2021-09-14 DIAGNOSIS — Z95828 Presence of other vascular implants and grafts: Secondary | ICD-10-CM

## 2021-09-14 NOTE — Progress Notes (Signed)
Port a cath flush orders faxed to [place].

## 2021-10-03 IMAGING — MR MR HEAD WO/W CM
14 series · 48 of 48 positions shown · IV contrast (gadavist)
Comparison: None.

CLINICAL DATA: Non-small cell lung cancer.  Staging

EXAM:
MRI HEAD WITHOUT AND WITH CONTRAST
TECHNIQUE: Multiplanar, multiecho pulse sequences of the brain and surrounding
structures were obtained without and with intravenous contrast.
CONTRAST:  7mL GADAVIST GADOBUTROL 1 MMOL/ML IV SOLN

[Series 5: DWI · axial · 3.0mm · 1.36mm/px · z∈[-27,+114]mm · 5 of 96 slices shown (1 of 4)]
[im 1/96]
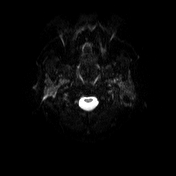
[im 24/96]
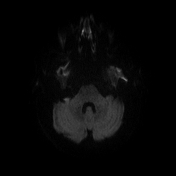
[im 48/96]
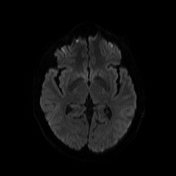
[im 72/96]
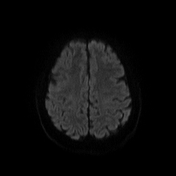
[im 96/96]
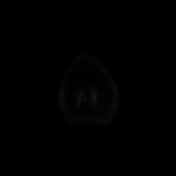

[Series 6: DWI · axial · 3.0mm · 1.36mm/px · z∈[-27,+114]mm · 3 of 48 slices shown (2 of 4)]
[im 1/48]
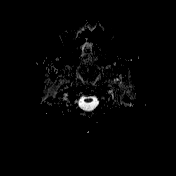
[im 24/48]
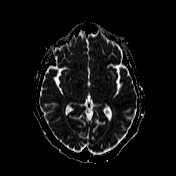
[im 48/48]
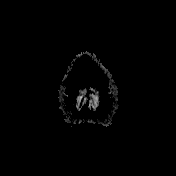

[Series 7: T1 · sagittal · 5.0mm · 0.75mm/px · 1 of 24 slices shown (1 of 2)]
[im 1/24]
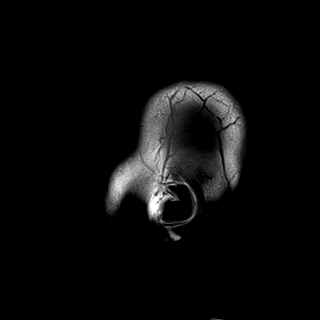

[Series 8: T2 · axial · 5.0mm · 0.62mm/px · 1 of 26 slices shown]
[im 1/26]
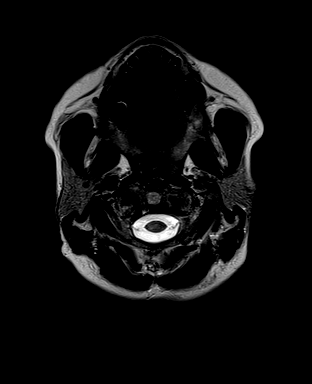

[Series 9: mip_images(sw) · axial · 24.0mm · 0.75mm/px · z∈[-54,+137]mm · 4 of 65 slices shown]
[im 1/65]
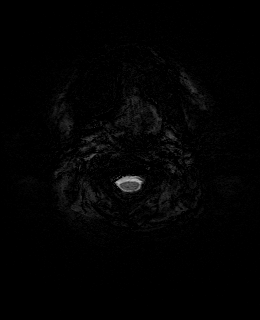
[im 22/65]
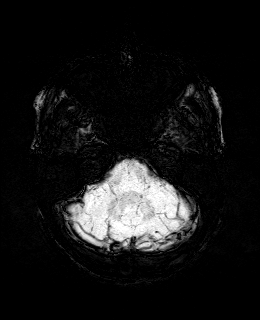
[im 43/65]
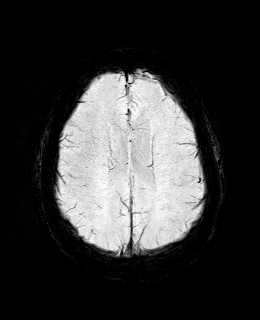
[im 65/65]
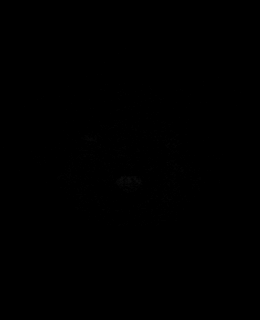

[Series 10: swi_images · axial · 3.0mm · 0.75mm/px · z∈[-64,+148]mm · 4 of 72 slices shown]
[im 1/72]
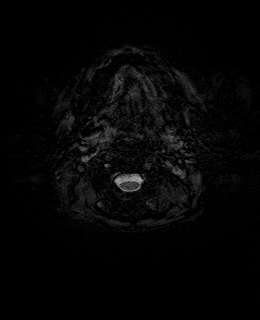
[im 24/72]
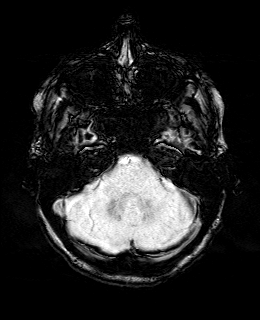
[im 48/72]
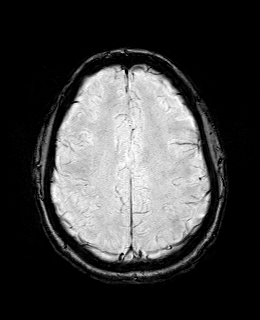
[im 72/72]
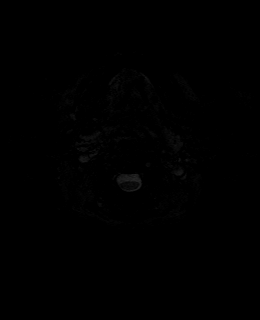

[Series 11: FLAIR · axial · 3.0mm · 0.75mm/px · z∈[-35,+118]mm · 3 of 52 slices shown]
[im 1/52]
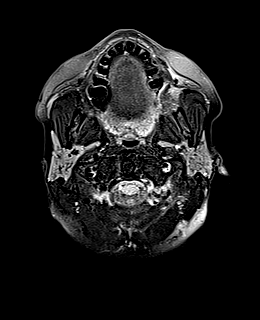
[im 26/52]
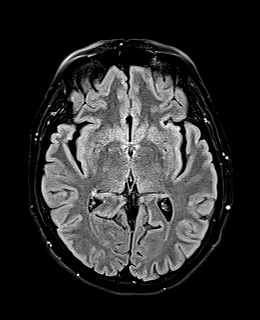
[im 52/52]
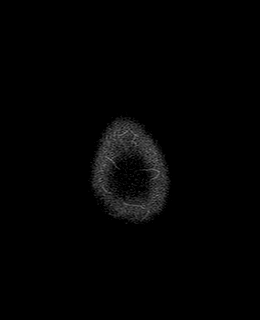

[Series 12: T1 · axial · 1.0mm · 0.94mm/px · z∈[-30,+113]mm · 8 of 144 slices shown (2 of 2)]
[im 1/144]
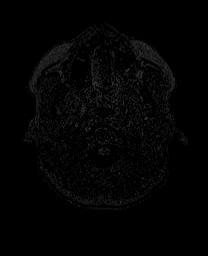
[im 21/144]
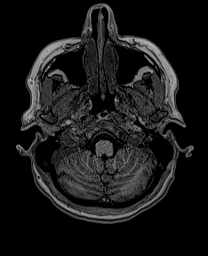
[im 41/144]
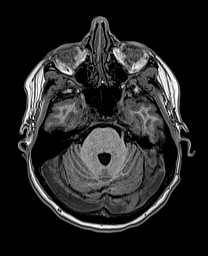
[im 62/144]
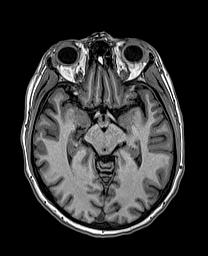
[im 82/144]
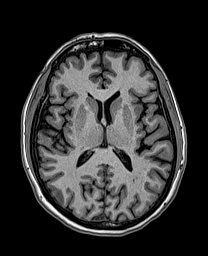
[im 103/144]
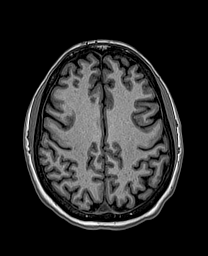
[im 123/144]
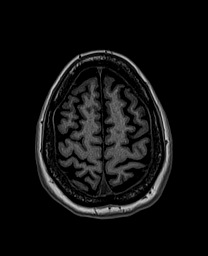
[im 144/144]
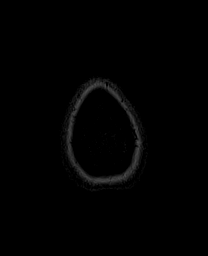

[Series 13: DWI · coronal · 5.0mm · 1.31mm/px · 4 of 68 slices shown (3 of 4)]
[im 1/68]
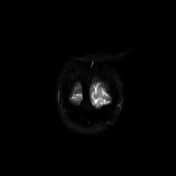
[im 23/68]
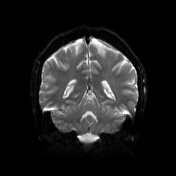
[im 45/68]
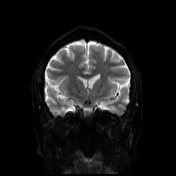
[im 68/68]
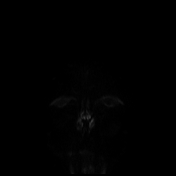

[Series 14: DWI · coronal · 5.0mm · 1.31mm/px · 2 of 34 slices shown (4 of 4)]
[im 1/34]
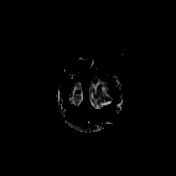
[im 34/34]
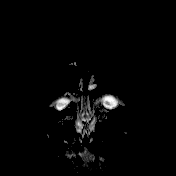

[Series 15: T2 post-contrast · coronal · 5.0mm · 0.57mm/px · 2 of 32 slices shown]
[im 1/32]
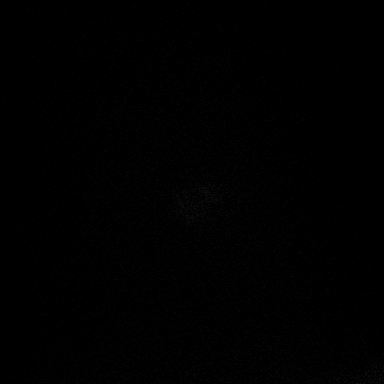
[im 32/32]
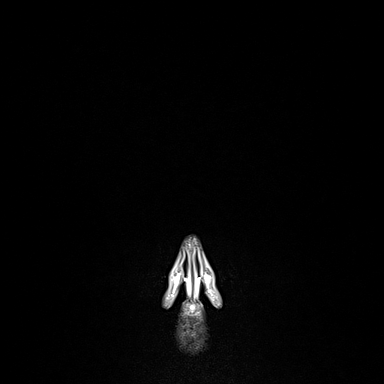

[Series 16: T1 post-contrast · axial · 1.0mm · 0.94mm/px · z∈[-30,+113]mm · 8 of 144 slices shown (1 of 3)]
[im 1/144]
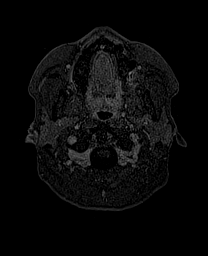
[im 21/144]
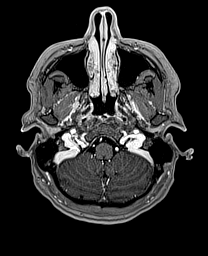
[im 41/144]
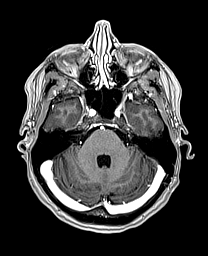
[im 62/144]
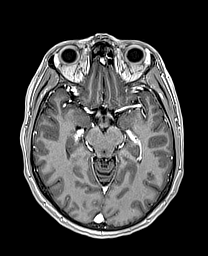
[im 82/144]
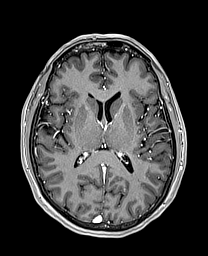
[im 103/144]
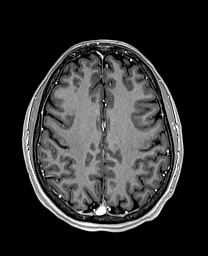
[im 123/144]
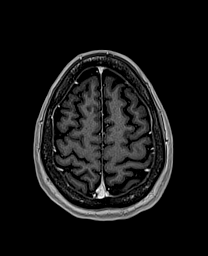
[im 144/144]
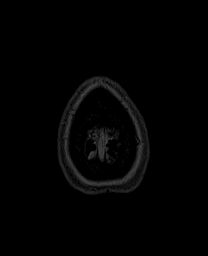

[Series 17: T1 post-contrast · coronal · 5.0mm · 0.43mm/px · 2 of 32 slices shown (2 of 3)]
[im 1/32]
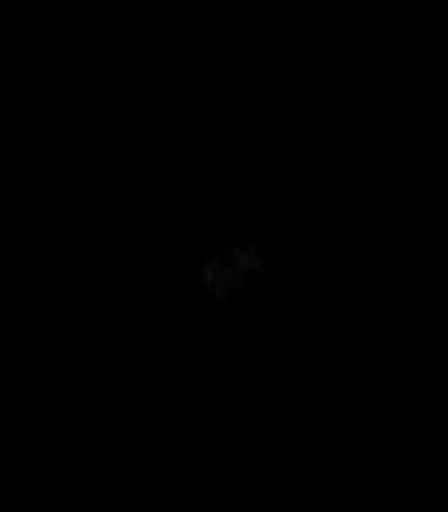
[im 32/32]
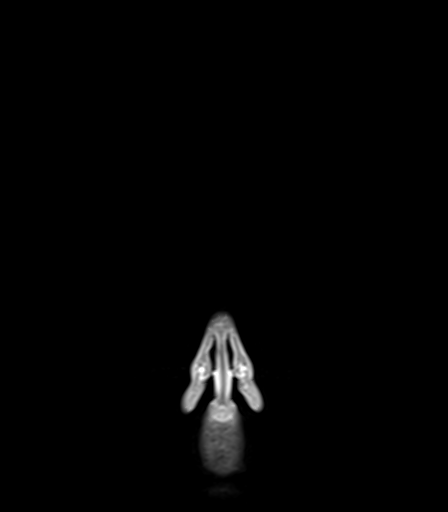

[Series 18: T1 post-contrast · sagittal · 5.0mm · 0.75mm/px · 1 of 24 slices shown (3 of 3)]
[im 1/24]
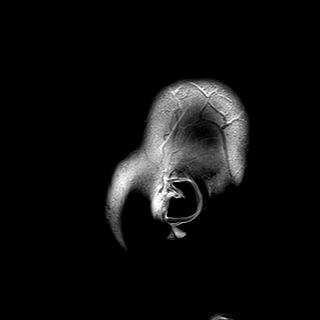

[48 of 48 positions shown; findings below may reference images not displayed]

FINDINGS: Brain: Ventricle size and cerebral volume normal. Negative for acute
infarct. Few small nonenhancing white matter hyperintensities
bilaterally. Negative for hemorrhage mass or edema

3 mm enhancing lesion left frontal cortex consistent with metastatic
disease. No other enhancing lesions.

Vascular: Normal arterial flow voids

Skull and upper cervical spine: No focal skull lesion.

Sinuses/Orbits: Paranasal sinuses clear.

Negative orbit

Other: None
IMPRESSION: 3 mm enhancing lesion left frontal cortex consistent with metastatic
disease. No second lesion.

Mild white matter changes consistent with chronic microvascular
ischemia.

## 2021-10-17 ENCOUNTER — Other Ambulatory Visit: Payer: Self-pay

## 2021-10-17 ENCOUNTER — Encounter: Payer: Self-pay | Admitting: General Practice

## 2021-10-17 NOTE — Progress Notes (Signed)
[place] Note  Met with Pam Wilson via [employer] [place] for Spiritual Care Virtual Visit, providing opportunity for her to share and process personal/spiritual work that she is doing for her own growth and healing. She is active in her church's ministries for people who have cancer and people who are homeless, and she is engaging in [employer] (journaling, intentional solitude, listening to meaningful podcasts, etc) that help her cope with her health (both cancer and spinal issues).  Provided empathic listening, normalization of feelings, pastoral reflection, and affirmation of strengths (self-awareness and reflection, utilizing coping tools, building supportive community, etc).  [name] plans to take some time to digest and reflect on this session and then to reach out to chaplain as desired for follow-up support. She also plans to continue to participate in [facility] Lung Cancer Support Group and national lung cancer support organizations.   [place], [place], [facility] Pager [phone] Voicemail [phone]

## 2021-10-23 ENCOUNTER — Encounter: Payer: Self-pay | Admitting: Internal Medicine

## 2021-10-25 ENCOUNTER — Other Ambulatory Visit: Payer: Self-pay

## 2021-10-31 ENCOUNTER — Inpatient Hospital Stay: Payer: BC Managed Care – PPO | Attending: Internal Medicine

## 2021-10-31 ENCOUNTER — Ambulatory Visit (HOSPITAL_COMMUNITY)
Admission: RE | Admit: 2021-10-31 | Discharge: 2021-10-31 | Disposition: A | Discharge status: Home / Self Care | Payer: BC Managed Care – PPO | Source: Ambulatory Visit | Attending: Internal Medicine | Admitting: Internal Medicine

## 2021-10-31 ENCOUNTER — Other Ambulatory Visit: Payer: Self-pay

## 2021-10-31 DIAGNOSIS — Z9221 Personal history of antineoplastic chemotherapy: Secondary | ICD-10-CM | POA: Insufficient documentation

## 2021-10-31 DIAGNOSIS — M549 Dorsalgia, unspecified: Secondary | ICD-10-CM | POA: Insufficient documentation

## 2021-10-31 DIAGNOSIS — C3490 Malignant neoplasm of unspecified part of unspecified bronchus or lung: Secondary | ICD-10-CM

## 2021-10-31 DIAGNOSIS — Z95828 Presence of other vascular implants and grafts: Secondary | ICD-10-CM

## 2021-10-31 DIAGNOSIS — Z85118 Personal history of other malignant neoplasm of bronchus and lung: Secondary | ICD-10-CM | POA: Insufficient documentation

## 2021-10-31 DIAGNOSIS — Z923 Personal history of irradiation: Secondary | ICD-10-CM | POA: Insufficient documentation

## 2021-10-31 LAB — CBC WITH DIFFERENTIAL (CANCER CENTER ONLY)
Abs Immature Granulocytes: 0.01 10*3/uL (ref 0.00–0.07)
Basophils Absolute: 0.1 10*3/uL (ref 0.0–0.1)
Basophils Relative: 1 %
Eosinophils Absolute: 0.1 10*3/uL (ref 0.0–0.5)
Eosinophils Relative: 2 %
HCT: 38.4 % (ref 36.0–46.0)
Hemoglobin: 12.8 g/dL (ref 12.0–15.0)
Immature Granulocytes: 0 %
Lymphocytes Relative: 20 %
Lymphs Abs: 1 10*3/uL (ref 0.7–4.0)
MCH: 28.1 pg (ref 26.0–34.0)
MCHC: 33.3 g/dL (ref 30.0–36.0)
MCV: 84.2 fL (ref 80.0–100.0)
Monocytes Absolute: 0.6 10*3/uL (ref 0.1–1.0)
Monocytes Relative: 13 %
Neutro Abs: 3.2 10*3/uL (ref 1.7–7.7)
Neutrophils Relative %: 64 %
Platelet Count: 197 10*3/uL (ref 150–400)
RBC: 4.56 MIL/uL (ref 3.87–5.11)
RDW: 15.8 % — ABNORMAL HIGH (ref 11.5–15.5)
WBC Count: 5.1 10*3/uL (ref 4.0–10.5)
nRBC: 0 % (ref 0.0–0.2)

## 2021-10-31 LAB — CMP (CANCER CENTER ONLY)
ALT: 19 U/L (ref 0–44)
AST: 20 U/L (ref 15–41)
Albumin: 4.5 g/dL (ref 3.5–5.0)
Alkaline Phosphatase: 34 U/L — ABNORMAL LOW (ref 38–126)
Anion gap: 8 (ref 5–15)
BUN: 13 mg/dL (ref 6–20)
CO2: 25 mmol/L (ref 22–32)
Calcium: 9.4 mg/dL (ref 8.9–10.3)
Chloride: 108 mmol/L (ref 98–111)
Creatinine: 0.69 mg/dL (ref 0.44–1.00)
GFR, Estimated: >60 mL/min (ref >60)
Glucose, Bld: 92 mg/dL (ref 70–99)
Potassium: 4.1 mmol/L (ref 3.5–5.1)
Sodium: 141 mmol/L (ref 135–145)
Total Bilirubin: 0.4 mg/dL (ref 0.3–1.2)
Total Protein: 7 g/dL (ref 6.5–8.1)

## 2021-10-31 MED ORDER — SODIUM CHLORIDE 0.9% FLUSH
10.0000 mL | INTRAVENOUS | Status: AC | PRN
  Administered 2021-10-31: 10 mL

## 2021-10-31 MED ORDER — SODIUM CHLORIDE (PF) 0.9 % IJ SOLN
INTRAMUSCULAR | Status: AC
  Filled 2021-10-31: qty 50

## 2021-10-31 MED ORDER — IOHEXOL 300 MG/ML  SOLN
75.0000 mL | Freq: Once | INTRAMUSCULAR | Status: AC | PRN
  Administered 2021-10-31: 75 mL via INTRAVENOUS

## 2021-10-31 MED ORDER — HEPARIN SOD (PORK) LOCK FLUSH 100 UNIT/ML IV SOLN
INTRAVENOUS | Status: AC
  Filled 2021-10-31: qty 5

## 2021-11-02 ENCOUNTER — Other Ambulatory Visit: Payer: Self-pay

## 2021-11-02 ENCOUNTER — Inpatient Hospital Stay (HOSPITAL_BASED_OUTPATIENT_CLINIC_OR_DEPARTMENT_OTHER): Payer: BC Managed Care – PPO | Admitting: Internal Medicine

## 2021-11-02 ENCOUNTER — Encounter: Payer: Self-pay | Admitting: Internal Medicine

## 2021-11-02 VITALS — BP 131/77 | HR 78 | Temp 97.5°F | Resp 15 | Wt 153.0 lb

## 2021-11-02 DIAGNOSIS — C3490 Malignant neoplasm of unspecified part of unspecified bronchus or lung: Secondary | ICD-10-CM | POA: Diagnosis not present

## 2021-11-02 DIAGNOSIS — Z85118 Personal history of other malignant neoplasm of bronchus and lung: Secondary | ICD-10-CM | POA: Diagnosis not present

## 2021-11-02 DIAGNOSIS — Z9221 Personal history of antineoplastic chemotherapy: Secondary | ICD-10-CM | POA: Diagnosis not present

## 2021-11-02 DIAGNOSIS — Z923 Personal history of irradiation: Secondary | ICD-10-CM | POA: Diagnosis not present

## 2021-11-02 DIAGNOSIS — M549 Dorsalgia, unspecified: Secondary | ICD-10-CM | POA: Diagnosis not present

## 2021-11-02 NOTE — Progress Notes (Signed)
[facility] [phone]   [phone]  OFFICE PROGRESS NOTE  System, Provider Not In No address on file   DIAGNOSIS: Stage IV (T1c, N3, M1c) non-small cell lung cancer, adenosquamous carcinoma presented with right upper lobe lung nodule in addition to mediastinal and supraclavicular lymphadenopathy diagnosed in March 2021.  The patient was also found to have metastatic disease to the brain.   PDL1: 100%   Guardant 360 Results: KRASG12V 0.2% Binimetinib   PRIOR THERAPY:  1) Concurrent chemoradiation with weekly carboplatin for AUC of 2 and paclitaxel 45 MG/M2.  First dose Jul 28, 2019.  Status post 6 cycles.  Last fraction of radiation was given on 09/08/2019. 2) Immunotherapy with Imfinzi every 2 weeks done at [facility] in [facility] under the care of Dr. Koleen Nimrod started July 2021.  She completed 1 year of treatment.  CURRENT THERAPY: Observation  INTERVAL HISTORY: Pam Wilson 56 y.o. female returns to the clinic today for 6 months follow-up visit.  The patient is feeling fine today with no concerning complaints except for intermittent back pain.  She has history of osteoporosis and she is currently on vitamin D and calcium.  She denied having any current chest pain, shortness of breath, cough or hemoptysis.  She has no nausea, vomiting, diarrhea or constipation.  She has no headache or visual changes.  She has no recent weight loss or night sweats.  She is currently on observation.  She had repeat CT scan of the chest performed recently and she is here for evaluation and discussion of her scan results.  MEDICAL HISTORY: Past Medical History:  Diagnosis Date   Diverticulosis    Dyslipidemia    Hashimoto's thyroiditis    Hypothyroidism    Internal jugular vein thrombosis, right (HCC)    Mitral valve prolapse    Prediabetes     ALLERGIES:  is allergic to sulfamethoxazole-trimethoprim, codeine, and  penicillins.  MEDICATIONS:  Current Outpatient Medications  Medication Sig Dispense Refill   apixaban (ELIQUIS) 5 MG TABS tablet Take 1 tablet (5 mg total) by mouth 2 (two) times daily. 30 tablet 1   DENTA 5000 PLUS 1.1 % CREA dental cream BRUSH THOUROGHLY TWICE DAILY DO NOT RINSE EAT OR DRINK AFTERWARD     levothyroxine (SYNTHROID) 112 MCG tablet Take by mouth.     liothyronine (CYTOMEL) 5 MCG tablet Take by mouth.     metFORMIN (GLUCOPHAGE-XR) 750 MG 24 hr tablet Take by mouth.     Multiple Vitamin (MULTI-VITAMIN) tablet Take by mouth.     tretinoin (RETIN-A) 0.1 % cream Apply 1 application topically at bedtime.     TYMLOS 3120 MCG/1.56ML SOPN Inject into the skin.     colesevelam (WELCHOL) 625 MG tablet Take 1,250 mg by mouth daily.     NON FORMULARY Take 4 mLs by mouth daily. Naloxone Liquid for Hashimotos     traMADol (ULTRAM) 50 MG tablet Take 50 mg by mouth every 6 (six) hours.     No current facility-administered medications for this visit.    SURGICAL HISTORY:  Past Surgical History:  Procedure Laterality Date   port Left 05/04/2019    REVIEW OF SYSTEMS:  A comprehensive review of systems was negative except for: Constitutional: positive for fatigue Musculoskeletal: positive for back pain   PHYSICAL EXAMINATION: General appearance: alert, cooperative, fatigued, and no distress Head: Normocephalic, without obvious abnormality, atraumatic Neck: no adenopathy, no JVD, supple, symmetrical, trachea midline, and thyroid  not enlarged, symmetric, no tenderness/mass/nodules Lymph nodes: Cervical, supraclavicular, and axillary nodes normal. Resp: clear to auscultation bilaterally Back: symmetric, no curvature. ROM normal. No CVA tenderness. Cardio: regular rate and rhythm, S1, S2 normal, no murmur, click, rub or gallop GI: soft, non-tender; bowel sounds normal; no masses,  no organomegaly Extremities: extremities normal, atraumatic, no cyanosis or edema  ECOG PERFORMANCE STATUS:  1 - Symptomatic but completely ambulatory  Blood pressure 131/77, pulse 78, temperature (!) 97.5 F (36.4 C), temperature source Oral, resp. rate 15, weight 153 lb (69.4 kg), SpO2 98 %.  LABORATORY DATA: Lab Results  Component Value Date   WBC 5.1 10/31/2021   HGB 12.8 10/31/2021   HCT 38.4 10/31/2021   MCV 84.2 10/31/2021   PLT 197 10/31/2021      Chemistry      Component Value Date/Time   NA 141 10/31/2021 1410   K 4.1 10/31/2021 1410   CL 108 10/31/2021 1410   CO2 25 10/31/2021 1410   BUN 13 10/31/2021 1410   CREATININE 0.69 10/31/2021 1410      Component Value Date/Time   CALCIUM 9.4 10/31/2021 1410   ALKPHOS 34 (L) 10/31/2021 1410   AST 20 10/31/2021 1410   ALT 19 10/31/2021 1410   BILITOT 0.4 10/31/2021 1410       RADIOGRAPHIC STUDIES: CT Chest W Contrast  Result Date: 11/01/2021 CLINICAL DATA:  Primary Cancer Type: Lung Imaging Indication: Routine surveillance Interval therapy since last imaging? No Initial Cancer Diagnosis Date: 05/2019; Established by: Biopsy-proven Detailed Pathology: Stage IV non-small cell lung cancer, adenosquamous carcinoma. Primary Tumor location:  Right upper lobe. Surgeries: None reported. Chemotherapy: Yes; Ongoing?  No; Most recent administration: 2021 Immunotherapy?  Yes; Type: Imfinzi; Ongoing? No Radiation therapy? Yes; Date Range: 07/2019 - 09/08/2019; Target: Right lung * Tracking Code: BO * EXAM: CT CHEST WITH CONTRAST TECHNIQUE: Multidetector CT imaging of the chest was performed during intravenous contrast administration. RADIATION DOSE REDUCTION: This exam was performed according to the departmental dose-optimization program which includes automated exposure control, adjustment of the mA and/or kV according to patient size and/or use of iterative reconstruction technique. CONTRAST:  41m OMNIPAQUE IOHEXOL 300 MG/ML  SOLN COMPARISON:  Most recent CT chest 05/03/2021 FINDINGS: Cardiovascular: No acute findings. Aortic and coronary  atherosclerotic calcification incidentally noted. Mediastinum/Nodes: No masses or pathologically enlarged lymph nodes identified. Lungs/Pleura: Band like opacity in the anterior right upper lobe shows decreased density since previous study, consistent with evolving post radiation changes. No residual mass is identified at this site. No other suspicious pulmonary nodules or masses identified. Mild centrilobular emphysema and bilateral lower lobe scarring show no significant change. Upper Abdomen:  Unremarkable. Musculoskeletal: No suspicious bone lesions. Several old thoracic vertebral body compression fractures are unchanged. IMPRESSION: Evolving post radiation changes in anterior right upper lobe. No evidence of recurrent or metastatic carcinoma within the thorax. Aortic Atherosclerosis (ICD10-I70.0) and Emphysema (ICD10-J43.9). Electronically Signed   By: JMarlaine HindM.D.   On: 11/01/2021 10:59    ASSESSMENT AND PLAN: This is a very pleasant 56years old white female recently diagnosed with a stage IIIb non-small cell lung cancer, adenosquamous carcinoma presented with right upper lobe lung nodule in addition to mediastinal and supraclavicular lymphadenopathy diagnosed in March 2021. Her molecular studies by guardant 360 showed no actionable mutation except for K-ras G12V.  Her PD-L1 expression is 100%. The patient underwent a course of concurrent chemoradiation with weekly carboplatin for AUC of 2 and paclitaxel 45 mg/M2 status post 6 cycles.  She completed the course of radiotherapy on 09/08/2019. The patient had partial response to this treatment. She completed a course of consolidation treatment with immunotherapy with Imfinzi in [facility] in November 2022. The patient is currently on observation and she is feeling fine with no concerning complaints. She had repeat CT scan of the chest performed recently.  I personally and independently reviewed the scan images and discussed the  results with the patient today. Her scan showed no concerning findings for disease recurrence or metastasis. I recommended for her to continue on observation with repeat CT scan of the chest in 6 months. She will continue to have Port-A-Cath flush every 2 months at the [facility] in [place]. The patient was advised to call immediately if she has any other concerning symptoms in the interval.  The patient voices understanding of current disease status and treatment options and is in agreement with the current care plan.  All questions were answered. The patient knows to call the clinic with any problems, questions or concerns. We can certainly see the patient much sooner if necessary.  Disclaimer: This note was dictated with voice recognition software. Similar sounding words can inadvertently be transcribed and may not be corrected upon review.

## 2021-11-03 ENCOUNTER — Other Ambulatory Visit: Payer: Self-pay

## 2021-11-04 ENCOUNTER — Other Ambulatory Visit: Payer: Self-pay

## 2021-11-17 ENCOUNTER — Telehealth: Payer: Self-pay

## 2021-11-17 NOTE — Telephone Encounter (Signed)
Pam Wilson, counseling intern, phoned patient to schedule counseling appointment.   Client and counselor will meet via [place], Monday 11/21/21 at [place]  Counseling Intern

## 2021-11-21 NOTE — Progress Notes (Signed)
Chandan Fly, Counseling Intern, spoke with Pam Wilson over the phone for her first counseling session.   Pam Wilson shared with the counselor her medical and personal history. Pam Wilson expressed that she experiences moments of anxiety. Pam Wilson shared she does daily work to aid in her healing. These include walking on the beach, breath work, meditation, writing in her journal, reading, listening to podcasts, and watching youtube.   Client and counselor plan to explore Pam Wilson's history and how that is affecting her now, mentally. Pam Wilson is finds value in living a positive life.   Counselor provided empathetic listening through nonverbal skills and reflection of content.   Pam Wilson scheduled her next appointment for Thurs, Sept. 14th at [place]  Counseling Intern

## 2021-12-08 NOTE — Progress Notes (Signed)
Pam Wilson, counseling intern, conducted second counseling session with patient via zoom.  Counselor and patient spoke about the patient's feelings of abandonment and their desire to forgive. Counselor and patient spoke about coping through hard emotions in lieu of working towards not having them at all.   The patient will explore moments with hard emotions this week and take note of what they are feeling mentally and physically and what event sparked the emotions.   The counselor and patient will meet for their third counseling session via zoom Thursday, September 21 st at 9 am.   Pam Wilson  Counseling Intern

## 2021-12-12 ENCOUNTER — Other Ambulatory Visit: Payer: Self-pay | Admitting: Internal Medicine

## 2021-12-12 ENCOUNTER — Encounter: Payer: Self-pay | Admitting: Internal Medicine

## 2021-12-12 DIAGNOSIS — I82890 Acute embolism and thrombosis of other specified veins: Secondary | ICD-10-CM

## 2021-12-12 MED ORDER — APIXABAN 5 MG PO TABS: 5.0000 mg | ORAL_TABLET | Freq: Two times a day (BID) | ORAL | 1 refills | Status: DC

## 2021-12-13 ENCOUNTER — Other Ambulatory Visit: Payer: Self-pay

## 2021-12-13 DIAGNOSIS — I82890 Acute embolism and thrombosis of other specified veins: Secondary | ICD-10-CM

## 2021-12-13 MED ORDER — APIXABAN 5 MG PO TABS: 5.0000 mg | ORAL_TABLET | Freq: Two times a day (BID) | ORAL | 1 refills | Status: DC

## 2021-12-15 NOTE — Progress Notes (Signed)
Pam Wilson, counseling intern, and patient met for third counseling session.   Patient and counselor spoke about the complexities of emotions - they are not good vs bad. Patient expressed their instinct is to run when feelings of hurt and abandonment arise.   Counselor introduced the acronym STOP.   S - Stop. Freeze physically and mentally.  T - Take a step back physically.  O - Observe. What is happing inside of you? What is happening outside of you (ex: the environment, other people, etc)? What are your thoughts and feelings? What are others saying or doing?  P - Proceed mindfully. Act with awareness. Consider your thoughts and feelings and others thoughts and feelings. Think about your goals. Which actions make the situation better or worse?   The patient will take notes over the next two weeks when big feelings arise in an attempt to understand them in a new way. What is happening around them? How are they feeling in their body and their mind? What are their goals? What are they hoping happens? What is their feeling telling them?   Patient scheduled next appointment for Monday, October 2nd at [facility]  Counseling Intern

## 2021-12-19 ENCOUNTER — Telehealth: Payer: Self-pay

## 2021-12-19 NOTE — Telephone Encounter (Signed)
Lysle Morales, counseling intern, called patient to change their meeting time because of a scheduling conflict.   The patient and counselor will now meet via [employer] on Monday, October 2nd at 12:15pm.   Lysle Morales,  Counseling Intern

## 2021-12-26 NOTE — Progress Notes (Signed)
Lysle Morales, counseling intern, met with patient for the patient's fourth counseling session.   Counselor and patient identified patient's tendency to engage in "flight" when their feelings are hurt or when they feel uncomfortable in a situation. The "flight" looks like leaving the situation or becoming quite. The patient experiences tension in their body and their mind spirals. The counselor introduced techniques to calm the body when feelings of "flight" arise in attempts to then calm the mind.   Counselor and client scheduled their next session for Monday, October 16 the at 12:30 pm.   Lysle Morales,  Counseling Intern

## 2021-12-29 ENCOUNTER — Encounter: Payer: Self-pay | Admitting: Internal Medicine

## 2021-12-30 ENCOUNTER — Telehealth: Payer: Self-pay | Admitting: Medical Oncology

## 2021-12-30 NOTE — Telephone Encounter (Signed)
"  Hi!  I've been having stomach pain and I have a swollen gland in my groin.  I got x rays done.   Can you take a look at them abs let me know your thoughts? I've also been put in steroids and am on antibiotic.  Thanks, Pam Wilson"  I told pt to have the ordering provider go over results with her and that I will pass on her message to Dr. Julien Nordmann to advise.

## 2022-01-03 ENCOUNTER — Telehealth: Payer: Self-pay | Admitting: Medical Oncology

## 2022-01-03 NOTE — Telephone Encounter (Signed)
Pt notified per Dr. Worthy Flank note: "I do not see anything concerning on the x-ray or the lab.  Continue with the antibiotics and steroid as recommended by the primary care.  "

## 2022-01-23 ENCOUNTER — Telehealth: Payer: Self-pay

## 2022-01-23 NOTE — Telephone Encounter (Signed)
Lysle Morales, counseling intern, called to check in on patient. Patient reported they are doing really well but they are really busy. The patient said they would call the counselor to make an appointment next week.   Lysle Morales,  Counseling Intern  [phone] [email or web address]

## 2022-02-07 ENCOUNTER — Other Ambulatory Visit: Payer: Self-pay | Admitting: *Deleted

## 2022-02-07 ENCOUNTER — Other Ambulatory Visit: Payer: Self-pay | Admitting: Internal Medicine

## 2022-02-07 ENCOUNTER — Telehealth: Payer: Self-pay | Admitting: *Deleted

## 2022-02-07 DIAGNOSIS — I82890 Acute embolism and thrombosis of other specified veins: Secondary | ICD-10-CM

## 2022-02-07 MED ORDER — APIXABAN 5 MG PO TABS: 5.0000 mg | ORAL_TABLET | Freq: Two times a day (BID) | ORAL | 1 refills | Status: DC

## 2022-02-07 NOTE — Telephone Encounter (Signed)
Morgin called back and asked that we send the Eliquis to [facility] in [place]. Is not going to use [facility]. Rx sent

## 2022-02-07 NOTE — Telephone Encounter (Signed)
Pam Wilson called to say her insurance has changed and she needs her Eliquis sent to [facility].

## 2022-02-07 NOTE — Telephone Encounter (Signed)
Pam Wilson states she changed insurance and her Eliquis is going to cost 197.00 deductible and then 47.00 co-pay. In January she will have to pay that again. She does not have the money to pay the deductibles.  Wants to know if warfarin is an option for her.

## 2022-02-10 ENCOUNTER — Other Ambulatory Visit: Payer: Self-pay | Admitting: Physician Assistant

## 2022-02-24 ENCOUNTER — Other Ambulatory Visit: Payer: Self-pay

## 2022-02-24 DIAGNOSIS — I82890 Acute embolism and thrombosis of other specified veins: Secondary | ICD-10-CM

## 2022-02-24 MED ORDER — APIXABAN 5 MG PO TABS: 5.0000 mg | ORAL_TABLET | Freq: Two times a day (BID) | ORAL | 0 refills | Status: AC

## 2022-02-28 ENCOUNTER — Encounter: Payer: Self-pay | Admitting: Internal Medicine

## 2022-05-05 ENCOUNTER — Other Ambulatory Visit: Payer: BC Managed Care – PPO

## 2022-05-08 ENCOUNTER — Other Ambulatory Visit: Payer: BC Managed Care – PPO

## 2022-05-08 ENCOUNTER — Ambulatory Visit: Payer: BC Managed Care – PPO | Admitting: Internal Medicine

## 2022-05-09 ENCOUNTER — Ambulatory Visit (HOSPITAL_COMMUNITY)
Admission: RE | Admit: 2022-05-09 | Discharge: 2022-05-09 | Disposition: A | Discharge status: Home / Self Care | Payer: Medicare PPO | Source: Ambulatory Visit | Attending: Internal Medicine | Admitting: Internal Medicine

## 2022-05-09 ENCOUNTER — Encounter: Payer: Self-pay | Admitting: Internal Medicine

## 2022-05-09 ENCOUNTER — Inpatient Hospital Stay: Payer: Medicare PPO | Attending: Internal Medicine

## 2022-05-09 DIAGNOSIS — C3491 Malignant neoplasm of unspecified part of right bronchus or lung: Secondary | ICD-10-CM

## 2022-05-09 DIAGNOSIS — C3431 Malignant neoplasm of lower lobe, right bronchus or lung: Secondary | ICD-10-CM

## 2022-05-09 DIAGNOSIS — C3411 Malignant neoplasm of upper lobe, right bronchus or lung: Secondary | ICD-10-CM

## 2022-05-09 DIAGNOSIS — C3490 Malignant neoplasm of unspecified part of unspecified bronchus or lung: Secondary | ICD-10-CM | POA: Insufficient documentation

## 2022-05-09 DIAGNOSIS — C7931 Secondary malignant neoplasm of brain: Secondary | ICD-10-CM | POA: Insufficient documentation

## 2022-05-09 DIAGNOSIS — Z95828 Presence of other vascular implants and grafts: Secondary | ICD-10-CM | POA: Insufficient documentation

## 2022-05-09 LAB — CBC WITH DIFFERENTIAL (CANCER CENTER ONLY)
Abs Immature Granulocytes: 0.02 10*3/uL (ref 0.00–0.07)
Basophils Absolute: 0.1 10*3/uL (ref 0.0–0.1)
Basophils Relative: 1 %
Eosinophils Absolute: 0.2 10*3/uL (ref 0.0–0.5)
Eosinophils Relative: 3 %
HCT: 38.2 % (ref 36.0–46.0)
Hemoglobin: 12.9 g/dL (ref 12.0–15.0)
Immature Granulocytes: 0 %
Lymphocytes Relative: 14 %
Lymphs Abs: 0.8 10*3/uL (ref 0.7–4.0)
MCH: 28 pg (ref 26.0–34.0)
MCHC: 33.8 g/dL (ref 30.0–36.0)
MCV: 83 fL (ref 80.0–100.0)
Monocytes Absolute: 0.7 10*3/uL (ref 0.1–1.0)
Monocytes Relative: 12 %
Neutro Abs: 4 10*3/uL (ref 1.7–7.7)
Neutrophils Relative %: 70 %
Platelet Count: 213 10*3/uL (ref 150–400)
RBC: 4.6 MIL/uL (ref 3.87–5.11)
RDW: 15.3 % (ref 11.5–15.5)
WBC Count: 5.7 10*3/uL (ref 4.0–10.5)
nRBC: 0 % (ref 0.0–0.2)

## 2022-05-09 LAB — CMP (CANCER CENTER ONLY)
ALT: 18 U/L (ref 0–44)
AST: 18 U/L (ref 15–41)
Albumin: 4.5 g/dL (ref 3.5–5.0)
Alkaline Phosphatase: 48 U/L (ref 38–126)
Anion gap: 9 (ref 5–15)
BUN: 16 mg/dL (ref 6–20)
CO2: 25 mmol/L (ref 22–32)
Calcium: 9.3 mg/dL (ref 8.9–10.3)
Chloride: 105 mmol/L (ref 98–111)
Creatinine: 0.73 mg/dL (ref 0.44–1.00)
GFR, Estimated: >60 mL/min (ref >60)
Glucose, Bld: 94 mg/dL (ref 70–99)
Potassium: 4 mmol/L (ref 3.5–5.1)
Sodium: 139 mmol/L (ref 135–145)
Total Bilirubin: 0.2 mg/dL — ABNORMAL LOW (ref 0.3–1.2)
Total Protein: 6.9 g/dL (ref 6.5–8.1)

## 2022-05-09 MED ORDER — IOHEXOL 300 MG/ML  SOLN
75.0000 mL | Freq: Once | INTRAMUSCULAR | Status: AC | PRN
  Administered 2022-05-09: 75 mL via INTRAVENOUS

## 2022-05-09 MED ORDER — HEPARIN SOD (PORK) LOCK FLUSH 100 UNIT/ML IV SOLN
INTRAVENOUS | Status: AC
  Filled 2022-05-09: qty 5

## 2022-05-09 MED ORDER — SODIUM CHLORIDE (PF) 0.9 % IJ SOLN
INTRAMUSCULAR | Status: AC
  Filled 2022-05-09: qty 50

## 2022-05-09 MED ORDER — HEPARIN SOD (PORK) LOCK FLUSH 100 UNIT/ML IV SOLN
500.0000 [IU] | Freq: Once | INTRAVENOUS | Status: AC
  Administered 2022-05-09: 500 [IU] via INTRAVENOUS

## 2022-05-09 MED ORDER — SODIUM CHLORIDE 0.9% FLUSH
10.0000 mL | Freq: Once | INTRAVENOUS | Status: AC
  Administered 2022-05-09: 10 mL

## 2022-05-10 ENCOUNTER — Inpatient Hospital Stay (HOSPITAL_BASED_OUTPATIENT_CLINIC_OR_DEPARTMENT_OTHER): Payer: Medicare PPO | Admitting: Internal Medicine

## 2022-05-10 VITALS — BP 122/78 | HR 97 | Temp 98.3°F | Resp 18 | Wt 160.5 lb

## 2022-05-10 DIAGNOSIS — C3411 Malignant neoplasm of upper lobe, right bronchus or lung: Secondary | ICD-10-CM | POA: Diagnosis present

## 2022-05-10 DIAGNOSIS — C3490 Malignant neoplasm of unspecified part of unspecified bronchus or lung: Secondary | ICD-10-CM

## 2022-05-10 DIAGNOSIS — C7931 Secondary malignant neoplasm of brain: Secondary | ICD-10-CM | POA: Diagnosis not present

## 2022-05-10 NOTE — Progress Notes (Signed)
[place] [phone]   Fax:(336) [phone]  OFFICE PROGRESS NOTE  System, Provider Not In No address on file   DIAGNOSIS: Stage IV (T1c, N3, M1c) non-small cell lung cancer, adenosquamous carcinoma presented with right upper lobe lung nodule in addition to mediastinal and supraclavicular lymphadenopathy diagnosed in March 2021.  The patient was also found to have metastatic disease to the brain.   PDL1: 100%   Guardant 360 Results: KRASG12V 0.2% Binimetinib   PRIOR THERAPY:  1) Concurrent chemoradiation with weekly carboplatin for AUC of 2 and paclitaxel 45 MG/M2.  First dose Jul 28, 2019.  Status post 6 cycles.  Last fraction of radiation was given on 09/08/2019. 2) Immunotherapy with Imfinzi every 2 weeks done at [facility] in [facility] under the care of Dr. Koleen Nimrod started July 2021.  She completed 1 year of treatment.  CURRENT THERAPY: Observation  INTERVAL HISTORY: Pam Wilson 57 y.o. female returns to the clinic today for 6 months follow-up visit.  The patient is feeling fine today with no concerning complaints except for occasional dizzy spells with change in position.  She denied having any headache or visual changes.  She has no current chest pain, shortness of breath, cough or hemoptysis.  She has no nausea, vomiting, diarrhea or constipation.  She denied having any recent weight loss or night sweats.  She is here today for evaluation with repeat CT scan of the chest for restaging of her disease.   MEDICAL HISTORY: Past Medical History:  Diagnosis Date   Diverticulosis    Dyslipidemia    Hashimoto's thyroiditis    Hypothyroidism    Internal jugular vein thrombosis, right (HCC)    Mitral valve prolapse    Prediabetes     ALLERGIES:  is allergic to sulfamethoxazole-trimethoprim, codeine, and penicillins.  MEDICATIONS:  Current Outpatient Medications  Medication Sig Dispense Refill   apixaban  (ELIQUIS) 5 MG TABS tablet Take 1 tablet (5 mg total) by mouth 2 (two) times daily. 180 tablet 0   colesevelam (WELCHOL) 625 MG tablet Take 1,250 mg by mouth daily.     DENTA 5000 PLUS 1.1 % CREA dental cream BRUSH THOUROGHLY TWICE DAILY DO NOT RINSE EAT OR DRINK AFTERWARD     levothyroxine (SYNTHROID) 112 MCG tablet Take by mouth.     liothyronine (CYTOMEL) 5 MCG tablet Take by mouth.     metFORMIN (GLUCOPHAGE-XR) 750 MG 24 hr tablet Take by mouth.     Multiple Vitamin (MULTI-VITAMIN) tablet Take by mouth.     NON FORMULARY Take 4 mLs by mouth daily. Naloxone Liquid for Hashimotos     traMADol (ULTRAM) 50 MG tablet Take 50 mg by mouth every 6 (six) hours.     tretinoin (RETIN-A) 0.1 % cream Apply 1 application topically at bedtime.     TYMLOS 3120 MCG/1.56ML SOPN Inject into the skin.     No current facility-administered medications for this visit.    SURGICAL HISTORY:  Past Surgical History:  Procedure Laterality Date   port Left 05/04/2019    REVIEW OF SYSTEMS:  A comprehensive review of systems was negative.   PHYSICAL EXAMINATION: General appearance: alert, cooperative, and no distress Head: Normocephalic, without obvious abnormality, atraumatic Neck: no adenopathy, no JVD, supple, symmetrical, trachea midline, and thyroid not enlarged, symmetric, no tenderness/mass/nodules Lymph nodes: Cervical, supraclavicular, and axillary nodes normal. Resp: clear to auscultation bilaterally Back: symmetric, no curvature. ROM normal. No CVA tenderness. Cardio: regular rate and  rhythm, S1, S2 normal, no murmur, click, rub or gallop GI: soft, non-tender; bowel sounds normal; no masses,  no organomegaly Extremities: extremities normal, atraumatic, no cyanosis or edema  ECOG PERFORMANCE STATUS: 0 - Asymptomatic  Blood pressure 122/78, pulse 97, temperature 98.3 F (36.8 C), temperature source Oral, resp. rate 18, weight 160 lb 8 oz (72.8 kg), SpO2 94 %.  LABORATORY DATA: Lab Results   Component Value Date   WBC 5.7 05/09/2022   HGB 12.9 05/09/2022   HCT 38.2 05/09/2022   MCV 83.0 05/09/2022   PLT 213 05/09/2022      Chemistry      Component Value Date/Time   NA 139 05/09/2022 1450   K 4.0 05/09/2022 1450   CL 105 05/09/2022 1450   CO2 25 05/09/2022 1450   BUN 16 05/09/2022 1450   CREATININE 0.73 05/09/2022 1450      Component Value Date/Time   CALCIUM 9.3 05/09/2022 1450   ALKPHOS 48 05/09/2022 1450   AST 18 05/09/2022 1450   ALT 18 05/09/2022 1450   BILITOT 0.2 (L) 05/09/2022 1450       RADIOGRAPHIC STUDIES: CT Chest W Contrast  Result Date: 05/10/2022 CLINICAL DATA:  Non-small cell lung cancer.  * Tracking Code: BO * EXAM: CT CHEST WITH CONTRAST TECHNIQUE: Multidetector CT imaging of the chest was performed during intravenous contrast administration. RADIATION DOSE REDUCTION: This exam was performed according to the departmental dose-optimization program which includes automated exposure control, adjustment of the mA and/or kV according to patient size and/or use of iterative reconstruction technique. CONTRAST:  35mL OMNIPAQUE IOHEXOL 300 MG/ML  SOLN COMPARISON:  10/31/2021. FINDINGS: Cardiovascular: Atherosclerotic calcification of the aorta, aortic valve and coronary arteries. Enlarged pulmonic trunk. Heart size normal. No pericardial effusion. Mediastinum/Nodes: No pathologically enlarged mediastinal, hilar or axillary lymph nodes. Post treatment soft tissue thickening in the right hilum, as before. Esophagus is grossly unremarkable. Lungs/Pleura: Centrilobular and paraseptal emphysema. Mild post treatment scarring in the right perihilar region. Subsegmental volume loss in both lower lobes. No new pulmonary nodules. No pleural fluid. Airway is unremarkable. Upper Abdomen: Visualized portions of the liver, adrenal glands, kidneys, spleen, pancreas, stomach and bowel are grossly unremarkable. No upper abdominal adenopathy. Musculoskeletal: Degenerative changes  in the spine. Multiple thoracic compression deformities, unchanged. No worrisome lytic or sclerotic lesions. IMPRESSION: 1. No evidence of recurrent or metastatic disease. 2. Aortic atherosclerosis (ICD10-I70.0). Coronary artery calcification. 3. Enlarged pulmonic trunk, indicative of pulmonary arterial hypertension. 4.  Emphysema (ICD10-J43.9). Electronically Signed   By: Lorin Picket M.D.   On: 05/10/2022 09:25    ASSESSMENT AND PLAN: This is a very pleasant 57  years old white female recently diagnosed with a stage IIIb non-small cell lung cancer, adenosquamous carcinoma presented with right upper lobe lung nodule in addition to mediastinal and supraclavicular lymphadenopathy diagnosed in March 2021. Her molecular studies by guardant 360 showed no actionable mutation except for K-ras G12V.  Her PD-L1 expression is 100%. The patient underwent a course of concurrent chemoradiation with weekly carboplatin for AUC of 2 and paclitaxel 45 mg/M2 status post 6 cycles.  She completed the course of radiotherapy on 09/08/2019. The patient had partial response to this treatment. She completed a course of consolidation treatment with immunotherapy with Imfinzi in [facility] in November 2022. The patient is currently on observation and she has been doing fine with no concerning complaints for the last 15 months. She had repeat CT scan of the chest performed recently.  I personally  and independently reviewed the scans and discussed the result with the patient today. Her scan showed no concerning findings for disease recurrence or metastasis. I recommended for her to continue on observation with repeat CT scan of the chest in 6 months. The patient will continue her Port-A-Cath flush at the [facility] in [place]. She was advised to call immediately if she has any other concerning symptoms in the interval. The patient voices understanding of current disease status and  treatment options and is in agreement with the current care plan.  All questions were answered. The patient knows to call the clinic with any problems, questions or concerns. We can certainly see the patient much sooner if necessary.  Disclaimer: This note was dictated with voice recognition software. Similar sounding words can inadvertently be transcribed and may not be corrected upon review.

## 2022-05-11 ENCOUNTER — Encounter: Payer: Self-pay | Admitting: Internal Medicine

## 2022-07-18 ENCOUNTER — Encounter: Payer: Self-pay | Admitting: Internal Medicine

## 2022-07-20 ENCOUNTER — Encounter: Payer: Self-pay | Admitting: Internal Medicine

## 2022-08-23 ENCOUNTER — Encounter: Payer: Self-pay | Admitting: Internal Medicine

## 2022-08-24 ENCOUNTER — Encounter: Payer: Self-pay | Admitting: Internal Medicine

## 2022-08-30 ENCOUNTER — Inpatient Hospital Stay: Payer: Medicare PPO | Attending: Internal Medicine

## 2022-08-30 ENCOUNTER — Other Ambulatory Visit: Payer: Self-pay

## 2022-08-30 DIAGNOSIS — Z452 Encounter for adjustment and management of vascular access device: Secondary | ICD-10-CM | POA: Insufficient documentation

## 2022-08-30 DIAGNOSIS — C3411 Malignant neoplasm of upper lobe, right bronchus or lung: Secondary | ICD-10-CM | POA: Insufficient documentation

## 2022-08-30 DIAGNOSIS — Z95828 Presence of other vascular implants and grafts: Secondary | ICD-10-CM

## 2022-08-30 MED ORDER — HEPARIN SOD (PORK) LOCK FLUSH 100 UNIT/ML IV SOLN: 500.0000 [IU] | Freq: Once | INTRAVENOUS | Status: AC

## 2022-08-30 MED ORDER — SODIUM CHLORIDE 0.9% FLUSH: 10.0000 mL | INTRAVENOUS | Status: AC | PRN

## 2022-08-30 NOTE — Patient Instructions (Signed)

## 2022-10-04 ENCOUNTER — Encounter: Payer: Self-pay | Admitting: Internal Medicine

## 2022-10-25 ENCOUNTER — Telehealth: Payer: Self-pay | Admitting: *Deleted

## 2022-10-25 NOTE — Telephone Encounter (Signed)
Patient called - she has moved to [place]. She cannot take time off for appts she has scheduled here. She wanted to know if Dr. Arbutus Ped could order her labs and scans to be done somewhere close to where she lives and then call her once he receives the results.  Dr. Asa Lente response: She can have the oncology provider she sees order the tests and scans and forward the results to me if she would like to make a phone appt to review them with me as well.  I'm not able to order them in a different facility.   Contacted patient with MD response. She said she understands, she was just really being hopeful that he could order tests where she is. Patient states she has appt to see Oncologist in [place], [place] December 08, 2022 as a new patient and that office told her provider will order any tests after first visit. She will keep the appt in [place], [place] and asked to have appts scheduled for August 12 and 14 here cancelled. Patient appts cancelled.

## 2022-11-06 ENCOUNTER — Other Ambulatory Visit: Payer: Medicare PPO

## 2022-11-08 ENCOUNTER — Ambulatory Visit: Payer: Medicare PPO | Admitting: Internal Medicine

## 2023-01-01 ENCOUNTER — Encounter: Payer: Self-pay | Admitting: Internal Medicine

## 2023-01-25 ENCOUNTER — Encounter: Payer: Self-pay | Admitting: Medical Oncology

## 2023-02-01 ENCOUNTER — Encounter: Payer: Self-pay | Admitting: Medical Oncology

## 2023-02-06 ENCOUNTER — Inpatient Hospital Stay: Payer: Medicare PPO | Attending: Internal Medicine | Admitting: Internal Medicine

## 2023-02-06 DIAGNOSIS — C3490 Malignant neoplasm of unspecified part of unspecified bronchus or lung: Secondary | ICD-10-CM

## 2023-02-06 NOTE — Progress Notes (Signed)
[facility] [phone]   Fax:(336) [phone]  PROGRESS NOTE FOR TELEMEDICINE VISITS  System, Provider Not In No address on file  I connected withNAME@ on 02/06/23 at  8:15 AM EST by telephone visit and verified that I am speaking with the correct person using two identifiers.   I discussed the limitations, risks, security and privacy concerns of performing an evaluation and management service by telemedicine and the availability of in-person appointments. I also discussed with the patient that there may be a patient responsible charge related to this service. The patient expressed understanding and agreed to proceed.  Other persons participating in the visit and their role in the encounter:  None  Patient's location:  Home Provider's location: [facility]  DIAGNOSIS: Stage IV (T1c, N3, M1c) non-small cell lung cancer, adenosquamous carcinoma presented with right upper lobe lung nodule in addition to mediastinal and supraclavicular lymphadenopathy diagnosed in March 2021.  The patient was also found to have metastatic disease to the brain.   PDL1: 100%   Guardant 360 Results: KRASG12V 0.2% Binimetinib   PRIOR THERAPY:  1) Concurrent chemoradiation with weekly carboplatin for AUC of 2 and paclitaxel 45 MG/M2.  First dose Jul 28, 2019.  Status post 6 cycles.  Last fraction of radiation was given on 09/08/2019. 2) Immunotherapy with Imfinzi every 2 weeks done at [facility] in [facility] under the care of Dr. Orson Aloe started July 2021.  She completed 1 year of treatment.   CURRENT THERAPY: Observation  INTERVAL HISTORY: Pam Wilson 57 y.o. female has a telephone virtual visit with me today for evaluation and discussion of her condition.Discussed the use of AI scribe software for clinical note transcription with the patient, who gave verbal consent to proceed.  History of Present Illness   Pam Wilson, a  57 year old patient, was diagnosed with stage 3 non-small cell lung cancer in March 2021. The patient underwent concurrent chemotherapy and radiation, followed by a year of immunotherapy with Imfinzi, which was completed in July 2022. Since then, the patient has been under regular observation.  The patient had a recent CT scan, which showed no signs of cancer. However, the scan revealed some narrowing of the arteries in the torso, which was identified as atherosclerosis. The patient has been managing this condition with diet and regular exercise, despite finding it challenging to see significant improvements.  The patient also has a history of a blood clotting disorder, specifically antithrombin three, which was recently tested and found to be within the normal range. Despite this, the patient expressed concerns about stopping blood thinners due to a previous blood clot.  The patient also had a port inserted for the cancer treatment. Recently, the patient was advised that the port could be removed, which the patient agreed to.  The patient had considered transferring care to a closer facility but decided against it and plans to continue care with the current provider. The patient expressed satisfaction with the care received at the current facility.       MEDICAL HISTORY: Past Medical History:  Diagnosis Date   Diverticulosis    Dyslipidemia    Hashimoto's thyroiditis    Hypothyroidism    Internal jugular vein thrombosis, right (HCC)    Mitral valve prolapse    Prediabetes     ALLERGIES:  is allergic to sulfamethoxazole-trimethoprim, codeine, and penicillins.  MEDICATIONS:  Current Outpatient Medications  Medication Sig Dispense Refill   apixaban (ELIQUIS) 5 MG TABS tablet Take  1 tablet (5 mg total) by mouth 2 (two) times daily. 180 tablet 0   colesevelam (WELCHOL) 625 MG tablet Take 1,250 mg by mouth daily.     DENTA 5000 PLUS 1.1 % CREA dental cream BRUSH THOUROGHLY TWICE DAILY DO  NOT RINSE EAT OR DRINK AFTERWARD     levothyroxine (SYNTHROID) 112 MCG tablet Take by mouth.     liothyronine (CYTOMEL) 5 MCG tablet Take by mouth.     metFORMIN (GLUCOPHAGE-XR) 750 MG 24 hr tablet Take by mouth.     Multiple Vitamin (MULTI-VITAMIN) tablet Take by mouth.     NON FORMULARY Take 4 mLs by mouth daily. Naloxone Liquid for Hashimotos     traMADol (ULTRAM) 50 MG tablet Take 50 mg by mouth every 6 (six) hours.     tretinoin (RETIN-A) 0.1 % cream Apply 1 application topically at bedtime.     TYMLOS 3120 MCG/1.56ML SOPN Inject into the skin.     No current facility-administered medications for this visit.   Facility-Administered Medications Ordered in Other Visits  Medication Dose Route Frequency Provider Last Rate Last Admin   heparin lock flush 100 unit/mL  500 Units Intravenous Once Si Gaul, MD       sodium chloride flush (NS) 0.9 % injection 10 mL  10 mL Intravenous PRN Si Gaul, MD        SURGICAL HISTORY:  Past Surgical History:  Procedure Laterality Date   port Left 05/04/2019    REVIEW OF SYSTEMS:  A comprehensive review of systems was negative.     LABORATORY DATA: Lab Results  Component Value Date   WBC 5.7 05/09/2022   HGB 12.9 05/09/2022   HCT 38.2 05/09/2022   MCV 83.0 05/09/2022   PLT 213 05/09/2022      Chemistry      Component Value Date/Time   NA 139 05/09/2022 1450   K 4.0 05/09/2022 1450   CL 105 05/09/2022 1450   CO2 25 05/09/2022 1450   BUN 16 05/09/2022 1450   CREATININE 0.73 05/09/2022 1450      Component Value Date/Time   CALCIUM 9.3 05/09/2022 1450   ALKPHOS 48 05/09/2022 1450   AST 18 05/09/2022 1450   ALT 18 05/09/2022 1450   BILITOT 0.2 (L) 05/09/2022 1450       RADIOGRAPHIC STUDIES: No results found.  ASSESSMENT AND PLAN: This is a very pleasant 57 years old white female recently diagnosed with a stage IIIb non-small cell lung cancer, adenosquamous carcinoma presented with right upper lobe lung nodule  in addition to mediastinal and supraclavicular lymphadenopathy diagnosed in March 2021. Her molecular studies by guardant 360 showed no actionable mutation except for K-ras G12V.  Her PD-L1 expression is 100%. The patient underwent a course of concurrent chemoradiation with weekly carboplatin for AUC of 2 and paclitaxel 45 mg/M2 status post 6 cycles.  She completed the course of radiotherapy on 09/08/2019. The patient had partial response to this treatment. She completed a course of consolidation treatment with immunotherapy with Imfinzi in [facility] in November 2022.    Stage III Non-Small Cell Lung Cancer (NSCLC) Diagnosed in March 2021. Treated with concurrent chemotherapy and radiation followed by one year of immunotherapy with Imfinzi, completed in July 2022. No evidence of recurrence or metastasis on recent CT scan. Discussed the importance of close monitoring due to the risk of new cancers. - Continue regular monitoring for recurrence - Schedule follow-up appointment in five months - Discuss potential for port removal and  re-insertion if necessary  Aortic Atherosclerosis Narrowing of the arteries in the torso noted on the last CT scan in February. Discussed that baby aspirin, diet, exercise, and cholesterol control are the best management strategies. 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 No evidence of Factor V Leiden. Discussed the balance between the risk of bleeding and the need for anticoagulation. Explained that normal lab results are reassuring. - Discontinue blood thinners - Discuss risks and benefits of discontinuation  General Health Maintenance Discussed the importance of lifestyle modifications for managing atherosclerosis. - Encourage  continued regular exercise - Recommend dietary modifications to control cholesterol  Follow-up - Ensure follow-up appointment is scheduled in five months.   The patient was advised to call immediately if she has any other concerning symptoms in the interval.  I discussed the assessment and treatment plan with the patient. The patient was provided an opportunity to ask questions and all were answered. The patient agreed with the plan and demonstrated an understanding of the instructions.   The patient was advised to call back or seek an in-person evaluation if the symptoms worsen or if the condition fails to improve as anticipated.  I provided 17 minutes of non face-to-face telephone visit time during this encounter, and > 50% was spent counseling as documented under my assessment & plan.  Lajuana Matte, MD 02/06/2023 8:27 AM  Disclaimer: This note was dictated with voice recognition software. Similar sounding words can inadvertently be transcribed and may not be corrected upon review.

## 2023-02-15 ENCOUNTER — Encounter: Payer: Self-pay | Admitting: Internal Medicine

## 2023-02-15 NOTE — Telephone Encounter (Signed)
Telephone call  

## 2023-03-09 ENCOUNTER — Telehealth: Payer: Self-pay | Admitting: Medical Oncology

## 2023-03-09 NOTE — Telephone Encounter (Signed)
Placed CD in Pam Wilson's inbasket .

## 2023-03-26 ENCOUNTER — Encounter: Payer: Self-pay | Admitting: Internal Medicine

## 2023-05-29 ENCOUNTER — Other Ambulatory Visit: Payer: Self-pay | Admitting: Internal Medicine

## 2023-05-29 ENCOUNTER — Encounter: Payer: Self-pay | Admitting: Internal Medicine

## 2023-05-31 ENCOUNTER — Telehealth: Payer: Self-pay | Admitting: Internal Medicine

## 2023-05-31 NOTE — Telephone Encounter (Signed)
 Called the patient to give her the number for radiology to schedule her scan per staff message.

## 2023-06-01 ENCOUNTER — Encounter: Payer: Self-pay | Admitting: Medical Oncology

## 2023-06-19 ENCOUNTER — Telehealth: Payer: Self-pay | Admitting: Medical Oncology

## 2023-06-19 ENCOUNTER — Encounter: Payer: Self-pay | Admitting: Internal Medicine

## 2023-06-19 DIAGNOSIS — C3490 Malignant neoplasm of unspecified part of unspecified bronchus or lung: Secondary | ICD-10-CM

## 2023-06-19 NOTE — Telephone Encounter (Signed)
 Pt requested referral to [facility] [place], [place]

## 2023-06-19 NOTE — Telephone Encounter (Signed)
 Referral documents faxed to [facility].

## 2023-07-11 ENCOUNTER — Ambulatory Visit: Admitting: Internal Medicine

## 2023-07-11 ENCOUNTER — Other Ambulatory Visit

## 2023-07-13 ENCOUNTER — Telehealth: Payer: Self-pay

## 2023-07-13 NOTE — Telephone Encounter (Signed)
 Radiology disk mailed to patient at current address.

## 2023-08-08 ENCOUNTER — Encounter: Payer: Self-pay | Admitting: Internal Medicine

## 2023-08-10 ENCOUNTER — Encounter: Payer: Self-pay | Admitting: Medical Oncology
